# Patient Record
Sex: Female | Born: 1940 | Race: White | Hispanic: Yes | Smoking: Never smoker
Health system: Southern US, Community
[De-identification: ages and names within clinical notes are randomized; demographics above are authoritative.]

## PROBLEM LIST (undated history)

## (undated) DIAGNOSIS — F32A Depression, unspecified: Secondary | ICD-10-CM

## (undated) DIAGNOSIS — E119 Type 2 diabetes mellitus without complications: Secondary | ICD-10-CM

## (undated) DIAGNOSIS — F419 Anxiety disorder, unspecified: Secondary | ICD-10-CM

## (undated) DIAGNOSIS — M1990 Unspecified osteoarthritis, unspecified site: Secondary | ICD-10-CM

## (undated) DIAGNOSIS — R609 Edema, unspecified: Secondary | ICD-10-CM

## (undated) DIAGNOSIS — F329 Major depressive disorder, single episode, unspecified: Secondary | ICD-10-CM

## (undated) DIAGNOSIS — I10 Essential (primary) hypertension: Secondary | ICD-10-CM

## (undated) DIAGNOSIS — N329 Bladder disorder, unspecified: Secondary | ICD-10-CM

## (undated) HISTORY — DX: Depression, unspecified: F32.A

## (undated) HISTORY — PX: KNEE SURGERY: SHX244

## (undated) HISTORY — DX: Major depressive disorder, single episode, unspecified: F32.9

## (undated) HISTORY — DX: Edema, unspecified: R60.9

## (undated) HISTORY — DX: Anxiety disorder, unspecified: F41.9

## (undated) HISTORY — DX: Essential (primary) hypertension: I10

## (undated) HISTORY — PX: ANKLE SURGERY: SHX546

## (undated) HISTORY — DX: Type 2 diabetes mellitus without complications: E11.9

## (undated) HISTORY — PX: FOOT SURGERY: SHX648

## (undated) HISTORY — DX: Unspecified osteoarthritis, unspecified site: M19.90

## (undated) HISTORY — DX: Bladder disorder, unspecified: N32.9

## (undated) HISTORY — PX: SHOULDER SURGERY: SHX246

---

## 2014-11-07 ENCOUNTER — Ambulatory Visit: Payer: Self-pay

## 2014-11-14 ENCOUNTER — Ambulatory Visit (INDEPENDENT_AMBULATORY_CARE_PROVIDER_SITE_OTHER): Payer: Medicare Other

## 2014-11-14 VITALS — BP 152/82 | HR 82 | Resp 18

## 2014-11-14 DIAGNOSIS — E11610 Type 2 diabetes mellitus with diabetic neuropathic arthropathy: Secondary | ICD-10-CM

## 2014-11-14 DIAGNOSIS — S92301S Fracture of unspecified metatarsal bone(s), right foot, sequela: Secondary | ICD-10-CM | POA: Diagnosis not present

## 2014-11-14 DIAGNOSIS — M158 Other polyosteoarthritis: Secondary | ICD-10-CM | POA: Diagnosis not present

## 2014-11-14 DIAGNOSIS — M2011 Hallux valgus (acquired), right foot: Secondary | ICD-10-CM | POA: Diagnosis not present

## 2014-11-14 DIAGNOSIS — R269 Unspecified abnormalities of gait and mobility: Secondary | ICD-10-CM

## 2014-11-14 DIAGNOSIS — M2041 Other hammer toe(s) (acquired), right foot: Secondary | ICD-10-CM | POA: Diagnosis not present

## 2014-11-14 DIAGNOSIS — E1140 Type 2 diabetes mellitus with diabetic neuropathy, unspecified: Secondary | ICD-10-CM

## 2014-11-14 MED ORDER — TRAMADOL HCL 50 MG PO TABS: 50.0000 mg | ORAL_TABLET | Freq: Two times a day (BID) | ORAL | Status: DC

## 2014-11-14 NOTE — Progress Notes (Signed)
Subjective:    Patient ID: [name], female    DOB: March 28, 1941, 74 y.o.   MRN: [id number]  HPI I BROKE MY RIGHT FOOT AND ANKLE LAST YEAR AND THEY STATED THAT MY TOES WERE BROKE AND 4 METATARSALS AND I FELL BOTH TIMES AND MY RIGHT FOOT POPPED BACK AND IT SWELLS AND WAS BRUISED    Review of Systems  Eyes: Positive for pain and itching.  Cardiovascular: Positive for leg swelling.  Genitourinary: Positive for urgency.  Musculoskeletal: Positive for gait problem.       JOINT PAIN  Allergic/Immunologic: Positive for food allergies.  Neurological: Positive for numbness. Negative for speech difficulty.  All other systems reviewed and are negative.      Objective:   Physical Exam 74 year old white female well-developed considerably overweight oriented 3 presents at this time with complaint of pain in difficult these with her right foot and fractured right ankle and then the sub-Schooley also fractured multiple metatarsals and toes of her right foot within the past year. Had plate fixation of her fibula on the right ankle apparently one or 2 wires placed metatarsal areas however x-rays that she brought from last year indicated only a single wire that was fixated with multiple metatarsal fractures and translocation or dislocation as well as notable HAV deformity and hammertoe deformities.'s role affecting her right foot. Should note patient's for lower extremity objective findings vascular status is intact DP and PT thready at one over 4 bilateral +1 to +2 edema noted bilateral. Epicritic and proprioceptive sensations intact although diminished on Semmes Weinstein to the forefoot digits and arch normal plantar response DTRs not listed. Dermatologically skin color pigment normal hair growth absent nails criptotic incurvated orthopedic biomechanical exam severe promontory changes noted bilateral with hammertoe contractures and bunion deformity bilateral right seems worse than left. The right foot is  severely abducted the ankle in the midfoot and forefoot with complaint at complete collapse of the arch x-rays confirm fracture metatarsals no new x-rays taken at this time to assess healing however based on the deformity and the forefoot midfoot and rear foot has severe pedis planus foot type of the right foot more so than left. Patient currently wearing a surgical type shoe or boot to accommodate the deformity she presents at this time with prescription for advanced prosthetics orthotic hoping to get diabetic shoes here however advised she was prescribed custom shoes which we do not provide any new prescription at this time for [employer] for custom shoes to be done here in [place] was given.       Assessment & Plan:  Assessment this time his diabetes with history peripheral neuropathy and angiopathy history of fracture and gait abnormality is resulted arthropathy and deformity of her right foot. Patient walks with the assistance of a walker and AFO boot or shoe. Benefit from custom molded shoes and double upright AFO attached brace to the right foot in boot.  Prescription for [employer] is given at this time. Patient also will follow-up within next week to 2 or 3 weeks for diabetic foot and nail care debridement of nails per patient request. 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 Recommendation is time is accommodate therapy with shoes and regular diabetic foot care and accommodation. We'll follow-up with the next several weeks for palliative care and follow-up and observe her custom shoes and AFO brace once those are available. Next patient also did request something  for pain prescription for tramadol is issued at this time she is already taking gabapentin and meloxicam.  Alvan Dame DPM

## 2014-11-14 NOTE — Patient Instructions (Signed)
Diabetes and Foot Care Diabetes may cause you to have problems because of poor blood supply (circulation) to your feet and legs. This may cause the skin on your feet to become thinner, break easier, and heal more slowly. Your skin may become dry, and the skin may peel and crack. You may also have nerve damage in your legs and feet causing decreased feeling in them. You may not notice minor injuries to your feet that could lead to infections or more serious problems. Taking care of your feet is one of the most important things you can do for yourself.  HOME CARE INSTRUCTIONS  Wear shoes at all times, even in the house. Do not go barefoot. Bare feet are easily injured.  Check your feet daily for blisters, cuts, and redness. If you cannot see the bottom of your feet, use a mirror or ask someone for help.  Wash your feet with warm water (do not use hot water) and mild soap. Then pat your feet and the areas between your toes until they are completely dry. Do not soak your feet as this can dry your skin.  Apply a moisturizing lotion or petroleum jelly (that does not contain alcohol and is unscented) to the skin on your feet and to dry, brittle toenails. Do not apply lotion between your toes.  Trim your toenails straight across. Do not dig under them or around the cuticle. File the edges of your nails with an emery board or nail file.  Do not cut corns or calluses or try to remove them with medicine.  Wear clean socks or stockings every day. Make sure they are not too tight. Do not wear knee-high stockings since they may decrease blood flow to your legs.  Wear shoes that fit properly and have enough cushioning. To break in new shoes, wear them for just a few hours a day. This prevents you from injuring your feet. Always look in your shoes before you put them on to be sure there are no objects inside.  Do not cross your legs. This may decrease the blood flow to your feet.  If you find a minor scrape,  cut, or break in the skin on your feet, keep it and the skin around it clean and dry. These areas may be cleansed with mild soap and water. Do not cleanse the area with peroxide, alcohol, or iodine.  When you remove an adhesive bandage, be sure not to damage the skin around it.  If you have a wound, look at it several times a day to make sure it is healing.  Do not use heating pads or hot water bottles. They may burn your skin. If you have lost feeling in your feet or legs, you may not know it is happening until it is too late.  Make sure your health care provider performs a complete foot exam at least annually or more often if you have foot problems. Report any cuts, sores, or bruises to your health care provider immediately. SEEK MEDICAL CARE IF:   You have an injury that is not healing.  You have cuts or breaks in the skin.  You have an ingrown nail.  You notice redness on your legs or feet.  You feel burning or tingling in your legs or feet.  You have pain or cramps in your legs and feet.  Your legs or feet are numb.  Your feet always feel cold. SEEK IMMEDIATE MEDICAL CARE IF:   There is increasing redness,   swelling, or pain in or around a wound.  There is a red line that goes up your leg.  Pus is coming from a wound.  You develop a fever or as directed by your health care provider.  You notice a bad smell coming from an ulcer or wound. Document Released: 09/20/2000 Document Revised: 05/26/2013 Document Reviewed: 03/02/2013 Exit[employer] Patient Information 2015 [employer]. This information is not intended to replace advice given to you by your health care provider. Make sure you discuss any questions you have with your health care provider.  

## 2014-11-28 ENCOUNTER — Ambulatory Visit (INDEPENDENT_AMBULATORY_CARE_PROVIDER_SITE_OTHER): Payer: Medicare Other

## 2014-11-28 VITALS — BP 159/68 | HR 85 | Resp 18

## 2014-11-28 DIAGNOSIS — M79676 Pain in unspecified toe(s): Secondary | ICD-10-CM

## 2014-11-28 DIAGNOSIS — R269 Unspecified abnormalities of gait and mobility: Secondary | ICD-10-CM

## 2014-11-28 DIAGNOSIS — E1140 Type 2 diabetes mellitus with diabetic neuropathy, unspecified: Secondary | ICD-10-CM

## 2014-11-28 DIAGNOSIS — B351 Tinea unguium: Secondary | ICD-10-CM

## 2014-11-28 DIAGNOSIS — E11610 Type 2 diabetes mellitus with diabetic neuropathic arthropathy: Secondary | ICD-10-CM

## 2014-11-28 DIAGNOSIS — R609 Edema, unspecified: Secondary | ICD-10-CM

## 2014-11-28 MED ORDER — TAVABOROLE 5 % EX SOLN: CUTANEOUS | Status: DC

## 2014-11-28 NOTE — Progress Notes (Signed)
   Subjective:    Patient ID: [name], female    DOB: Jul 11, 1941, 74 y.o.   MRN: [id number]  HPI MY NAILS ARE REALLY THICK AND NEED TO BE CUT    Review of Systems no new findings or systemic changes noted. Patient has had increases and edema is not wearing compression stockings take a fluid pill however has venous insufficiency and edema and venous stasis dermatitis of both lower extremities.     Objective:   Physical Exam Neurovascular status unchanged pedal pulses are palpable DP +2 PT plus one over 4 bilateral Refill timed 3-4 seconds there is +2 pitting edema both lower extremities with venous insufficiency and venous stasis the patient walks with the assistance of a walker nails thick brittle crumbly friable discolored and brittle was not been a candidate for on time fungal this time requesting treatment for the fungus and prescription for Jodi GeraldsKerydin is issued through the online pharmacy will apply daily as instructed patient advised she needs to apply once daily for 12 month duration. Remainder the exam unremarkable no open wounds no ulcers ulcers no secondary infections.       Assessment & Plan:  Assessment diabetes with complications arthrosis and Charcot like changes of the foot walks with gait abnormality utilizing walker for assistance this time painful brittle crumbly friable thick discolored nails 1 through 5 bilateral are debrided and presence of pain symptomology as well as diabetes and complications. Also dispensed prescription for Kerydin to be furnished onto the patient will apply daily for 12 months duration as instructed. Reappointed 3 months for nail care  Alvan Dameichard Bladyn Tipps DPM

## 2014-11-28 NOTE — Patient Instructions (Signed)
Onychomycosis/Fungal Toenails  WHAT IS IT? An infection that lies within the keratin of your nail plate that is caused by a fungus.  WHY ME? Fungal infections affect all ages, sexes, races, and creeds.  There may be many factors that predispose you to a fungal infection such as age, coexisting medical conditions such as diabetes, or an autoimmune disease; stress, medications, fatigue, genetics, etc.  Bottom line: fungus thrives in a warm, moist environment and your shoes offer such a location.  IS IT CONTAGIOUS? Theoretically, yes.  You do not want to share shoes, nail clippers or files with someone who has fungal toenails.  Walking around barefoot in the same room or sleeping in the same bed is unlikely to transfer the organism.  It is important to realize, however, that fungus can spread easily from one nail to the next on the same foot.  HOW DO WE TREAT THIS?  There are several ways to treat this condition.  Treatment may depend on many factors such as age, medications, pregnancy, liver and kidney conditions, etc.  It is best to ask your doctor which options are available to you.  1. No treatment.   Unlike many other medical concerns, you can live with this condition.  However for many people this can be a painful condition and may lead to ingrown toenails or a bacterial infection.  It is recommended that you keep the nails cut short to help reduce the amount of fungal nail. 2. Topical treatment.  These range from herbal remedies to prescription strength nail lacquers.  About 40-50% effective, topicals require twice daily application for approximately 9 to 12 months or until an entirely new nail has grown out.  The most effective topicals are medical grade medications available through physicians offices. 3. Oral antifungal medications.  With an 80-90% cure rate, the most common oral medication requires 3 to 4 months of therapy and stays in your system for a year as the new nail grows out.  Oral  antifungal medications do require blood work to make sure it is a safe drug for you.  A liver function panel will be performed prior to starting the medication and after the first month of treatment.  It is important to have the blood work performed to avoid any harmful side effects.  In general, this medication safe but blood work is required. 4. Laser Therapy.  This treatment is performed by applying a specialized laser to the affected nail plate.  This therapy is noninvasive, fast, and non-painful.  It is not covered by insurance and is therefore, out of pocket.  The results have been very good with a 80-95% cure rate.  [facility] is the only practice in the area to offer this therapy. 5. Permanent Nail Avulsion.  Removing the entire nail so that a new nail will not grow back.   Josph Machobtain Kerydin by mail through [facility]. Apply medication once daily to each affected nail for 12 months as instructed

## 2015-01-26 ENCOUNTER — Ambulatory Visit: Payer: Medicare Other

## 2015-01-27 ENCOUNTER — Ambulatory Visit: Payer: Medicare Other

## 2015-12-05 DIAGNOSIS — J449 Chronic obstructive pulmonary disease, unspecified: Secondary | ICD-10-CM | POA: Diagnosis present

## 2015-12-05 DIAGNOSIS — E782 Mixed hyperlipidemia: Secondary | ICD-10-CM | POA: Diagnosis present

## 2015-12-05 DIAGNOSIS — G4733 Obstructive sleep apnea (adult) (pediatric): Secondary | ICD-10-CM | POA: Diagnosis present

## 2015-12-05 DIAGNOSIS — F331 Major depressive disorder, recurrent, moderate: Secondary | ICD-10-CM | POA: Diagnosis present

## 2015-12-05 DIAGNOSIS — F5101 Primary insomnia: Secondary | ICD-10-CM | POA: Diagnosis present

## 2015-12-05 DIAGNOSIS — F068 Other specified mental disorders due to known physiological condition: Secondary | ICD-10-CM | POA: Diagnosis present

## 2015-12-05 DIAGNOSIS — F411 Generalized anxiety disorder: Secondary | ICD-10-CM | POA: Diagnosis present

## 2015-12-05 DIAGNOSIS — E1142 Type 2 diabetes mellitus with diabetic polyneuropathy: Secondary | ICD-10-CM | POA: Diagnosis present

## 2015-12-05 DIAGNOSIS — I10 Essential (primary) hypertension: Secondary | ICD-10-CM | POA: Diagnosis present

## 2015-12-05 DIAGNOSIS — I340 Nonrheumatic mitral (valve) insufficiency: Secondary | ICD-10-CM | POA: Diagnosis present

## 2015-12-05 DIAGNOSIS — R269 Unspecified abnormalities of gait and mobility: Secondary | ICD-10-CM

## 2015-12-05 DIAGNOSIS — G500 Trigeminal neuralgia: Secondary | ICD-10-CM | POA: Diagnosis present

## 2015-12-05 DIAGNOSIS — M5090 Cervical disc disorder, unspecified, unspecified cervical region: Secondary | ICD-10-CM | POA: Diagnosis present

## 2015-12-20 DIAGNOSIS — E088 Diabetes mellitus due to underlying condition with unspecified complications: Secondary | ICD-10-CM | POA: Diagnosis present

## 2015-12-20 DIAGNOSIS — E785 Hyperlipidemia, unspecified: Secondary | ICD-10-CM | POA: Diagnosis present

## 2016-08-07 DIAGNOSIS — Z66 Do not resuscitate: Secondary | ICD-10-CM | POA: Diagnosis present

## 2016-08-07 DIAGNOSIS — N3941 Urge incontinence: Secondary | ICD-10-CM | POA: Diagnosis present

## 2016-08-28 ENCOUNTER — Inpatient Hospital Stay (HOSPITAL_COMMUNITY): Payer: Medicare Other

## 2016-08-28 ENCOUNTER — Inpatient Hospital Stay (HOSPITAL_COMMUNITY)
Admission: AD | Admit: 2016-08-28 | Discharge: 2016-09-03 | DRG: 871 | Disposition: A | Discharge status: Skilled Nursing Facility | Payer: Medicare Other | Source: Other Acute Inpatient Hospital | Attending: Family Medicine | Admitting: Family Medicine

## 2016-08-28 DIAGNOSIS — Z66 Do not resuscitate: Secondary | ICD-10-CM | POA: Diagnosis present

## 2016-08-28 DIAGNOSIS — J449 Chronic obstructive pulmonary disease, unspecified: Secondary | ICD-10-CM | POA: Diagnosis not present

## 2016-08-28 DIAGNOSIS — G9341 Metabolic encephalopathy: Secondary | ICD-10-CM | POA: Diagnosis present

## 2016-08-28 DIAGNOSIS — E1142 Type 2 diabetes mellitus with diabetic polyneuropathy: Secondary | ICD-10-CM | POA: Diagnosis present

## 2016-08-28 DIAGNOSIS — M5090 Cervical disc disorder, unspecified, unspecified cervical region: Secondary | ICD-10-CM | POA: Diagnosis present

## 2016-08-28 DIAGNOSIS — K219 Gastro-esophageal reflux disease without esophagitis: Secondary | ICD-10-CM | POA: Diagnosis not present

## 2016-08-28 DIAGNOSIS — Z880 Allergy status to penicillin: Secondary | ICD-10-CM

## 2016-08-28 DIAGNOSIS — L8990 Pressure ulcer of unspecified site, unspecified stage: Secondary | ICD-10-CM | POA: Insufficient documentation

## 2016-08-28 DIAGNOSIS — Z7982 Long term (current) use of aspirin: Secondary | ICD-10-CM

## 2016-08-28 DIAGNOSIS — E785 Hyperlipidemia, unspecified: Secondary | ICD-10-CM | POA: Diagnosis present

## 2016-08-28 DIAGNOSIS — A419 Sepsis, unspecified organism: Secondary | ICD-10-CM

## 2016-08-28 DIAGNOSIS — R410 Disorientation, unspecified: Secondary | ICD-10-CM | POA: Diagnosis present

## 2016-08-28 DIAGNOSIS — Z794 Long term (current) use of insulin: Secondary | ICD-10-CM

## 2016-08-28 DIAGNOSIS — E870 Hyperosmolality and hypernatremia: Secondary | ICD-10-CM | POA: Diagnosis present

## 2016-08-28 DIAGNOSIS — E860 Dehydration: Secondary | ICD-10-CM | POA: Diagnosis present

## 2016-08-28 DIAGNOSIS — Z888 Allergy status to other drugs, medicaments and biological substances status: Secondary | ICD-10-CM

## 2016-08-28 DIAGNOSIS — N390 Urinary tract infection, site not specified: Secondary | ICD-10-CM

## 2016-08-28 DIAGNOSIS — F331 Major depressive disorder, recurrent, moderate: Secondary | ICD-10-CM | POA: Diagnosis present

## 2016-08-28 DIAGNOSIS — N179 Acute kidney failure, unspecified: Secondary | ICD-10-CM | POA: Diagnosis present

## 2016-08-28 DIAGNOSIS — N3941 Urge incontinence: Secondary | ICD-10-CM | POA: Diagnosis present

## 2016-08-28 DIAGNOSIS — F5101 Primary insomnia: Secondary | ICD-10-CM | POA: Diagnosis present

## 2016-08-28 DIAGNOSIS — Z79899 Other long term (current) drug therapy: Secondary | ICD-10-CM

## 2016-08-28 DIAGNOSIS — E782 Mixed hyperlipidemia: Secondary | ICD-10-CM | POA: Diagnosis present

## 2016-08-28 DIAGNOSIS — G9340 Encephalopathy, unspecified: Secondary | ICD-10-CM

## 2016-08-28 DIAGNOSIS — E11649 Type 2 diabetes mellitus with hypoglycemia without coma: Secondary | ICD-10-CM | POA: Diagnosis not present

## 2016-08-28 DIAGNOSIS — F411 Generalized anxiety disorder: Secondary | ICD-10-CM | POA: Diagnosis present

## 2016-08-28 DIAGNOSIS — F0390 Unspecified dementia without behavioral disturbance: Secondary | ICD-10-CM | POA: Diagnosis present

## 2016-08-28 DIAGNOSIS — A4181 Sepsis due to Enterococcus: Secondary | ICD-10-CM | POA: Diagnosis present

## 2016-08-28 DIAGNOSIS — I10 Essential (primary) hypertension: Secondary | ICD-10-CM | POA: Diagnosis not present

## 2016-08-28 DIAGNOSIS — Z7401 Bed confinement status: Secondary | ICD-10-CM | POA: Diagnosis not present

## 2016-08-28 DIAGNOSIS — E876 Hypokalemia: Secondary | ICD-10-CM | POA: Diagnosis not present

## 2016-08-28 DIAGNOSIS — R269 Unspecified abnormalities of gait and mobility: Secondary | ICD-10-CM

## 2016-08-28 DIAGNOSIS — Z515 Encounter for palliative care: Secondary | ICD-10-CM | POA: Diagnosis not present

## 2016-08-28 DIAGNOSIS — E088 Diabetes mellitus due to underlying condition with unspecified complications: Secondary | ICD-10-CM | POA: Diagnosis not present

## 2016-08-28 DIAGNOSIS — G4733 Obstructive sleep apnea (adult) (pediatric): Secondary | ICD-10-CM | POA: Diagnosis present

## 2016-08-28 DIAGNOSIS — F068 Other specified mental disorders due to known physiological condition: Secondary | ICD-10-CM | POA: Diagnosis present

## 2016-08-28 DIAGNOSIS — R4189 Other symptoms and signs involving cognitive functions and awareness: Secondary | ICD-10-CM

## 2016-08-28 DIAGNOSIS — Z1621 Resistance to vancomycin: Secondary | ICD-10-CM | POA: Diagnosis present

## 2016-08-28 DIAGNOSIS — M1990 Unspecified osteoarthritis, unspecified site: Secondary | ICD-10-CM | POA: Diagnosis present

## 2016-08-28 DIAGNOSIS — I340 Nonrheumatic mitral (valve) insufficiency: Secondary | ICD-10-CM | POA: Diagnosis present

## 2016-08-28 DIAGNOSIS — G500 Trigeminal neuralgia: Secondary | ICD-10-CM | POA: Diagnosis present

## 2016-08-28 DIAGNOSIS — W19XXXA Unspecified fall, initial encounter: Secondary | ICD-10-CM

## 2016-08-28 DIAGNOSIS — Z9181 History of falling: Secondary | ICD-10-CM | POA: Diagnosis not present

## 2016-08-28 DIAGNOSIS — G939 Disorder of brain, unspecified: Secondary | ICD-10-CM | POA: Diagnosis present

## 2016-08-28 LAB — COMPREHENSIVE METABOLIC PANEL
ALBUMIN: 2.2 g/dL — AB (ref 3.5–5.0)
ALK PHOS: 77 U/L (ref 38–126)
ALT: 26 U/L (ref 14–54)
AST: 41 U/L (ref 15–41)
BUN: 127 mg/dL — ABNORMAL HIGH (ref 6–20)
CALCIUM: 8.3 mg/dL — AB (ref 8.9–10.3)
CO2: 20 mmol/L — AB (ref 22–32)
Creatinine, Ser: 4.19 mg/dL — ABNORMAL HIGH (ref 0.44–1.00)
GFR calc non Af Amer: 9 mL/min — ABNORMAL LOW (ref >60)
GFR, EST AFRICAN AMERICAN: 11 mL/min — AB (ref >60)
GLUCOSE: 205 mg/dL — AB (ref 65–99)
POTASSIUM: 6.5 mmol/L — AB (ref 3.5–5.1)
SODIUM: 172 mmol/L — AB (ref 135–145)
Total Bilirubin: 0.5 mg/dL (ref 0.3–1.2)
Total Protein: 5.7 g/dL — ABNORMAL LOW (ref 6.5–8.1)

## 2016-08-28 LAB — URINALYSIS, ROUTINE W REFLEX MICROSCOPIC
Bilirubin Urine: NEGATIVE
GLUCOSE, UA: NEGATIVE mg/dL
Ketones, ur: NEGATIVE mg/dL
Nitrite: NEGATIVE
PROTEIN: NEGATIVE mg/dL
SPECIFIC GRAVITY, URINE: 1.017 (ref 1.005–1.030)
pH: 5 (ref 5.0–8.0)

## 2016-08-28 LAB — HIV ANTIBODY (ROUTINE TESTING W REFLEX): HIV SCREEN 4TH GENERATION: NONREACTIVE

## 2016-08-28 LAB — CBC WITH DIFFERENTIAL/PLATELET
BASOS PCT: 0 %
Basophils Absolute: 0 10*3/uL (ref 0.0–0.1)
EOS ABS: 0 10*3/uL (ref 0.0–0.7)
EOS PCT: 0 %
HCT: 53.2 % — ABNORMAL HIGH (ref 36.0–46.0)
HEMOGLOBIN: 16.4 g/dL — AB (ref 12.0–15.0)
LYMPHS PCT: 7 %
Lymphs Abs: 2.2 10*3/uL (ref 0.7–4.0)
MCH: 29.5 pg (ref 26.0–34.0)
MCHC: 30.8 g/dL (ref 30.0–36.0)
MCV: 95.7 fL (ref 78.0–100.0)
MONO ABS: 1.3 10*3/uL — AB (ref 0.1–1.0)
Monocytes Relative: 4 %
NEUTROS PCT: 89 %
Neutro Abs: 28.5 10*3/uL — ABNORMAL HIGH (ref 1.7–7.7)
PLATELETS: 257 10*3/uL (ref 150–400)
RBC: 5.56 MIL/uL — AB (ref 3.87–5.11)
RDW: 16.6 % — ABNORMAL HIGH (ref 11.5–15.5)
WBC: 32 10*3/uL — AB (ref 4.0–10.5)

## 2016-08-28 LAB — GLUCOSE, CAPILLARY
GLUCOSE-CAPILLARY: 132 mg/dL — AB (ref 65–99)
GLUCOSE-CAPILLARY: 79 mg/dL (ref 65–99)
GLUCOSE-CAPILLARY: 171 mg/dL — AB (ref 65–99)
Glucose-Capillary: 127 mg/dL — ABNORMAL HIGH (ref 65–99)

## 2016-08-28 LAB — LACTIC ACID, PLASMA
LACTIC ACID, VENOUS: 2.5 mmol/L — AB (ref 0.5–1.9)
Lactic Acid, Venous: 2.5 mmol/L (ref 0.5–1.9)

## 2016-08-28 LAB — SODIUM, URINE, RANDOM: SODIUM UR: 18 mmol/L

## 2016-08-28 LAB — RAPID URINE DRUG SCREEN, HOSP PERFORMED
Amphetamines: NOT DETECTED
BARBITURATES: NOT DETECTED
BENZODIAZEPINES: NOT DETECTED
COCAINE: NOT DETECTED
Opiates: NOT DETECTED
Tetrahydrocannabinol: NOT DETECTED

## 2016-08-28 LAB — PROTIME-INR
INR: 1.34
Prothrombin Time: 16.7 seconds — ABNORMAL HIGH (ref 11.4–15.2)

## 2016-08-28 LAB — BASIC METABOLIC PANEL
BUN: 129 mg/dL — ABNORMAL HIGH (ref 6–20)
CALCIUM: 8.4 mg/dL — AB (ref 8.9–10.3)
CO2: 22 mmol/L (ref 22–32)
CREATININE: 4.1 mg/dL — AB (ref 0.44–1.00)
Chloride: >130 mmol/L (ref 101–111)
GFR, EST AFRICAN AMERICAN: 11 mL/min — AB (ref >60)
GFR, EST NON AFRICAN AMERICAN: 10 mL/min — AB (ref >60)
Glucose, Bld: 133 mg/dL — ABNORMAL HIGH (ref 65–99)
Potassium: 5.8 mmol/L — ABNORMAL HIGH (ref 3.5–5.1)
SODIUM: 175 mmol/L — AB (ref 135–145)

## 2016-08-28 LAB — URINE MICROSCOPIC-ADD ON

## 2016-08-28 LAB — TROPONIN I
TROPONIN I: 0.13 ng/mL — AB (ref <0.03)
Troponin I: 0.03 ng/mL (ref <0.03)
Troponin I: 0.03 ng/mL (ref <0.03)

## 2016-08-28 LAB — APTT: APTT: 22 s — AB (ref 24–36)

## 2016-08-28 LAB — AMMONIA: AMMONIA: 21 umol/L (ref 9–35)

## 2016-08-28 LAB — CREATININE, URINE, RANDOM: CREATININE, URINE: 93.75 mg/dL

## 2016-08-28 LAB — PHOSPHORUS: Phosphorus: 6.2 mg/dL — ABNORMAL HIGH (ref 2.5–4.6)

## 2016-08-28 LAB — MAGNESIUM: MAGNESIUM: 3 mg/dL — AB (ref 1.7–2.4)

## 2016-08-28 LAB — RPR: RPR: NONREACTIVE

## 2016-08-28 LAB — CK: Total CK: 180 U/L (ref 38–234)

## 2016-08-28 LAB — MRSA PCR SCREENING: MRSA by PCR: POSITIVE — AB

## 2016-08-28 MED ORDER — HYDROCHLOROTHIAZIDE 25 MG PO TABS: 25.0000 mg | ORAL_TABLET | Freq: Every day | ORAL | Status: DC

## 2016-08-28 MED ORDER — PRAVASTATIN SODIUM 20 MG PO TABS
20.0000 mg | ORAL_TABLET | Freq: Every day | ORAL | Status: DC
  Administered 2016-09-03: 20 mg via ORAL
  Filled 2016-08-28: qty 1

## 2016-08-28 MED ORDER — SODIUM CHLORIDE 0.9 % IV SOLN
1.0000 g | Freq: Once | INTRAVENOUS | Status: AC
  Administered 2016-08-28: 1 g via INTRAVENOUS
  Filled 2016-08-28: qty 10

## 2016-08-28 MED ORDER — LORAZEPAM 2 MG/ML IJ SOLN
0.5000 mg | Freq: Every evening | INTRAMUSCULAR | Status: DC | PRN
  Administered 2016-08-31: 0.5 mg via INTRAVENOUS
  Filled 2016-08-28: qty 1

## 2016-08-28 MED ORDER — ONDANSETRON HCL 4 MG/2ML IJ SOLN: 4.0000 mg | Freq: Four times a day (QID) | INTRAMUSCULAR | Status: DC | PRN

## 2016-08-28 MED ORDER — GABAPENTIN 600 MG PO TABS: 600.0000 mg | ORAL_TABLET | Freq: Three times a day (TID) | ORAL | Status: DC

## 2016-08-28 MED ORDER — SODIUM CHLORIDE 0.9% FLUSH
3.0000 mL | Freq: Two times a day (BID) | INTRAVENOUS | Status: DC
  Administered 2016-08-28 – 2016-09-03 (×8): 3 mL via INTRAVENOUS

## 2016-08-28 MED ORDER — ORAL CARE MOUTH RINSE
15.0000 mL | Freq: Two times a day (BID) | OROMUCOSAL | Status: DC
  Administered 2016-08-29 – 2016-09-03 (×9): 15 mL via OROMUCOSAL

## 2016-08-28 MED ORDER — SODIUM CHLORIDE 0.45 % IV SOLN
INTRAVENOUS | Status: DC
  Administered 2016-08-28: 13:00:00 via INTRAVENOUS

## 2016-08-28 MED ORDER — SODIUM CHLORIDE 0.9 % IV SOLN: 250.0000 mL | INTRAVENOUS | Status: DC | PRN

## 2016-08-28 MED ORDER — CHLORHEXIDINE GLUCONATE 0.12 % MT SOLN
15.0000 mL | Freq: Two times a day (BID) | OROMUCOSAL | Status: DC
  Administered 2016-08-28 – 2016-09-03 (×12): 15 mL via OROMUCOSAL
  Filled 2016-08-28 (×10): qty 15

## 2016-08-28 MED ORDER — LISINOPRIL 20 MG PO TABS: 40.0000 mg | ORAL_TABLET | Freq: Every day | ORAL | Status: DC

## 2016-08-28 MED ORDER — MELOXICAM 7.5 MG PO TABS: 7.5000 mg | ORAL_TABLET | Freq: Every day | ORAL | Status: DC

## 2016-08-28 MED ORDER — INSULIN ASPART 100 UNIT/ML ~~LOC~~ SOLN
0.0000 [IU] | Freq: Every day | SUBCUTANEOUS | Status: DC
Start: 2016-08-28 — End: 2016-08-29

## 2016-08-28 MED ORDER — ACETAMINOPHEN 650 MG RE SUPP
650.0000 mg | Freq: Four times a day (QID) | RECTAL | Status: DC | PRN
Start: 2016-08-28 — End: 2016-09-03

## 2016-08-28 MED ORDER — INSULIN GLARGINE 100 UNIT/ML ~~LOC~~ SOLN
32.0000 [IU] | Freq: Every day | SUBCUTANEOUS | Status: DC
  Administered 2016-08-29 – 2016-09-03 (×6): 32 [IU] via SUBCUTANEOUS
  Filled 2016-08-28 (×6): qty 0.32

## 2016-08-28 MED ORDER — INSULIN ASPART 100 UNIT/ML IV SOLN
10.0000 [IU] | Freq: Once | INTRAVENOUS | Status: AC
  Administered 2016-08-28: 10 [IU] via INTRAVENOUS

## 2016-08-28 MED ORDER — SODIUM CHLORIDE 0.9% FLUSH
3.0000 mL | Freq: Two times a day (BID) | INTRAVENOUS | Status: DC
  Administered 2016-08-28 – 2016-09-03 (×8): 3 mL via INTRAVENOUS

## 2016-08-28 MED ORDER — SODIUM CHLORIDE 0.9% FLUSH: 3.0000 mL | INTRAVENOUS | Status: DC | PRN

## 2016-08-28 MED ORDER — ONDANSETRON HCL 4 MG PO TABS: 4.0000 mg | ORAL_TABLET | Freq: Four times a day (QID) | ORAL | Status: DC | PRN

## 2016-08-28 MED ORDER — SODIUM CHLORIDE 0.9 % IV SOLN
500.0000 mg | Freq: Two times a day (BID) | INTRAVENOUS | Status: DC
  Administered 2016-08-28 – 2016-08-29 (×3): 500 mg via INTRAVENOUS
  Filled 2016-08-28 (×4): qty 0.5

## 2016-08-28 MED ORDER — HYDRALAZINE HCL 20 MG/ML IJ SOLN: 5.0000 mg | Freq: Three times a day (TID) | INTRAMUSCULAR | Status: DC | PRN

## 2016-08-28 MED ORDER — LORAZEPAM 0.5 MG PO TABS: 0.5000 mg | ORAL_TABLET | Freq: Every evening | ORAL | Status: DC | PRN

## 2016-08-28 MED ORDER — HEPARIN SODIUM (PORCINE) 5000 UNIT/ML IJ SOLN
5000.0000 [IU] | Freq: Three times a day (TID) | INTRAMUSCULAR | Status: DC
  Administered 2016-08-28 – 2016-09-03 (×20): 5000 [IU] via SUBCUTANEOUS
  Filled 2016-08-28 (×18): qty 1

## 2016-08-28 MED ORDER — ALBUTEROL SULFATE (2.5 MG/3ML) 0.083% IN NEBU: 2.5000 mg | INHALATION_SOLUTION | RESPIRATORY_TRACT | Status: DC | PRN

## 2016-08-28 MED ORDER — ASPIRIN 81 MG PO CHEW: 81.0000 mg | CHEWABLE_TABLET | Freq: Every day | ORAL | Status: DC

## 2016-08-28 MED ORDER — BUPROPION HCL 100 MG PO TABS
100.0000 mg | ORAL_TABLET | Freq: Every day | ORAL | Status: DC
  Filled 2016-08-28: qty 1

## 2016-08-28 MED ORDER — ACETAMINOPHEN 325 MG PO TABS: 650.0000 mg | ORAL_TABLET | Freq: Four times a day (QID) | ORAL | Status: DC | PRN

## 2016-08-28 MED ORDER — INSULIN ASPART 100 UNIT/ML ~~LOC~~ SOLN
0.0000 [IU] | Freq: Three times a day (TID) | SUBCUTANEOUS | Status: DC
  Administered 2016-08-28: 1 [IU] via SUBCUTANEOUS

## 2016-08-28 MED ORDER — SODIUM CHLORIDE 0.9 % IV BOLUS (SEPSIS)
1000.0000 mL | Freq: Once | INTRAVENOUS | Status: AC
  Administered 2016-08-28: 1000 mL via INTRAVENOUS

## 2016-08-28 MED ORDER — DEXTROSE 5 % AND 0.45 % NACL IV BOLUS
1000.0000 mL | Freq: Once | INTRAVENOUS | Status: AC
  Administered 2016-08-28: 1000 mL via INTRAVENOUS

## 2016-08-28 MED ORDER — VENLAFAXINE HCL ER 150 MG PO CP24: 150.0000 mg | ORAL_CAPSULE | Freq: Every day | ORAL | Status: DC

## 2016-08-28 NOTE — Progress Notes (Signed)
[facility] Shireen QuanSchuster is a 75 year old female with pmh HTN, DM, arthritis, dementia, and bedbound patient who was [facility] nursing facility [facility] with a 4 days history of confusion. Vitals are relatively stable with O2 saturations maintained on 2 L of [place] oxygen. Labs reveal been preceded 28.5, sodium 176, potassium 8.1, BUN 139, creatinine 4.9, glucose 181, and lactic acid 2.4. Urinalysis positive for infection. Patient given Rocephin, total 4 L of normal saline IV fluid, sodium bicarbonate, insulin, calcium gluconate, and dextrose. CPK pending. Foley catheter in place. Patient is a DO NOT RESUSCITATE transfering for possible need of consultative services. Transferring to stepdown bed.

## 2016-08-28 NOTE — Progress Notes (Signed)
CRITICAL VALUE ALERT  Critical value received:  Lactic Acid 2.5  Date of notification:  08/28/2016  Time of notification:  12:09 PM  Critical value read back:yes   Paged MD Margot AblesMerrill to inform of critical value.

## 2016-08-28 NOTE — H&P (Signed)
History and Physical    [name] ZOX:[id number] DOB: 01/21/1941 DOA: 08/28/2016   PCP: Feliciana Rossetti, MD   Patient coming from:  Home  Chief Complaint: confusion  HPI: [name] is a 75 y.o. female with medical history significant for HTN, diabetes, arthritis, dementia, generalized anxiety disorder, COPD, hyperlipidemia, insomnia, history of trigeminal neuralgia, history of major depressive disorders, obstructive sleep apnea,History of recurrent UTIs  Recently treated with 3 days of Cipro from 08-18-10/14 , bedbound patient brought from nursing facility to [facility] with a 4 day history of confusion. At the time of visit, patient is very confused and unable to provide history.   Per chart report, the patient had been seen at [facility] multiple times due to falls, abnormal glucose issues, etc., and was last seen at the PCP office on 08/07/2016, acutely ill, but at the time was not appeared  toxic. At the time  her medications were adjusted to improve her overall clinical status without t significant improvement She has been admitted for continuation of care, at step down bed. She is DNR   At [facility], Theodoro Kos were relatively stable with O2 saturations maintained on 2 L of [place] oxygen.  sodium 176, potassium 8.1, BUN 139, creatinine 4.9, glucose 181, and lactic acid 2.4. Urinalysis was positive for infection. CPK 268. Tn neg  Patient given Rocephin, total 4 L of normal saline IV fluid, sodium bicarbonate, insulin, calcium gluconate, and dextrose.  On admission,  New labs as follows: MRSA by PCR +. WBC 32  CXR negative for acute changes. Abd XR neg for acute findings. EKG SR. Tn neg PT 16.7/ INR 1.34 Glu 134   Hb 16.4  (in th esetting of dehydration)  sodium 172 potassium 6.5 (slowly improving)  chloride 130 bicarb 20 BUN 127 creatinine 4.19, calcium 8.3 CK 180  UDS is negative  Attempted to call the number listed in contacts but no answer was obtained.  On  presentation   BP (!) 117/39 (BP Location: Right Arm)   Pulse 75   Temp 98.6 F (37 C) (Axillary)   Resp 17   SpO2 96%  Labs are pending     Review of Systems: As per HPI otherwise 10 point review of systems negative.   Past Medical History:  Diagnosis Date  . Anxiety   . Arthritis   . Bladder problem   . Depression   . Diabetes mellitus without complication   . Hypertension   . Swelling     Past Surgical History:  Procedure Laterality Date  . ANKLE SURGERY     RIGHT  . FOOT SURGERY     RIGHT FOOT  . KNEE SURGERY    . SHOULDER SURGERY      Social History Social History   Social History  . Marital status: Widowed    Spouse name: N/A  . Number of children: N/A  . Years of education: N/A   Occupational History  . Not on file.   Social History Main Topics  . Smoking status: Never Smoker  . Smokeless tobacco: Never Used  . Alcohol use No  . Drug use: No  . Sexual activity: Not on file   Other Topics Concern  . Not on file   Social History Narrative  . No narrative on file     Allergies  Allergen Reactions  . Penicillins Rash    No family history on file.    Prior to Admission medications   Medication Sig Start Date End Date Taking?  Authorizing Provider  amLODipine (NORVASC) 5 MG tablet Take 5 mg by mouth daily.    Historical Provider, MD  aspirin 81 MG chewable tablet Chew 81 mg by mouth.    Historical Provider, MD  buPROPion (WELLBUTRIN) 100 MG tablet Take 100 mg by mouth.    Historical Provider, MD  gabapentin (NEURONTIN) 600 MG tablet Take 600 mg by mouth 3 (three) times daily.    Historical Provider, MD  hydrochlorothiazide (HYDRODIURIL) 25 MG tablet Take 25 mg by mouth daily.    Historical Provider, MD  insulin glargine (LANTUS) 100 UNIT/ML injection Inject 64 Units into the skin.    Historical Provider, MD  lisinopril (PRINIVIL,ZESTRIL) 40 MG tablet Take 40 mg by mouth.    Historical Provider, MD  LORazepam (ATIVAN) 0.5 MG tablet Take 0.5  mg by mouth.    Historical Provider, MD  meloxicam (MOBIC) 7.5 MG tablet Take 7.5 mg by mouth.    Historical Provider, MD  oxycodone (OXY-IR) 5 MG capsule Take 5 mg by mouth.    Historical Provider, MD  pravastatin (PRAVACHOL) 20 MG tablet Take 20 mg by mouth daily.    Historical Provider, MD  Tavaborole (KERYDIN) 5 % SOLN Apply one drop each affected nail once daily for 12 months 11/28/14   Alvan Dameichard Sikora, DPM  traMADol (ULTRAM) 50 MG tablet Take 1 tablet (50 mg total) by mouth 2 (two) times daily. 11/14/14   Alvan Dameichard Sikora, DPM  UNABLE TO FIND Take 1 capsule by mouth.    Historical Provider, MD  UNABLE TO FIND Take 1 tablet by mouth. 03/03/14   Historical Provider, MD  venlafaxine XR (EFFEXOR-XR) 150 MG 24 hr capsule Take 150 mg by mouth daily with breakfast.    Historical Provider, MD    Physical Exam:  Constitutional: uncomfortable, ill  appearing, lethargic, minimally responsive  Vitals:   08/28/16 0817  BP: (!) 117/39  Pulse: 75  Resp: 17  Temp: 98.6 F (37 C)  TempSrc: Axillary  SpO2: 96%   Eyes: PERRL, lids and conjunctivae normal ENMT: Mucous membranes are dry. Posterior pharynx clear of any exudate or lesions.edentulous  Neck: normal, supple, no masses, no thyromegaly Respiratory: diffuse rhonchi L>R, crackles builaterally, decreased breath sounds on the Right  No accessory muscle use.  Cardiovascular: Regular rate and rhythm, 1/6  murmurs / rubs / gallops. Trace edema on the LLE, 1 + on the RLE extremity edema. 2+ pedal pulses. No carotid bruits.  Abdomen:  Obese, no apparent  tenderness, no masses palpated. No hepatosplenomegaly. Bowel sounds positive. Foley without blood  Musculoskeletal: no clubbing / cyanosis. Visible deformity on feet, decreased muscle tone   Skin: no rashes, lesions, ulcers.  Neurologic: unable to assess as patient is minimally interactive. Does respond to touch, light and sound    Labs on Admission: pending   CBC:  Recent Labs Lab 08/28/16 0912    WBC 32.0*  NEUTROABS 28.5*  HGB 16.4*  HCT 53.2*  MCV 95.7  PLT 257    Basic Metabolic Panel:  Recent Labs Lab 08/28/16 0912  NA 172*  K 6.5*  CL >130*  CO2 20*  GLUCOSE 205*  BUN 127*  CREATININE 4.19*  CALCIUM 8.3*  MG 3.0*  PHOS 6.2*    GFR: CrCl cannot be calculated (Unknown ideal weight.).  Liver Function Tests:  Recent Labs Lab 08/28/16 0912  AST 41  ALT 26  ALKPHOS 77  BILITOT 0.5  PROT 5.7*  ALBUMIN 2.2*   No results for input(s): LIPASE, AMYLASE in  the last 168 hours.  Recent Labs Lab 08/28/16 0912  AMMONIA 21    Coagulation Profile:  Recent Labs Lab 08/28/16 0912  INR 1.34    Cardiac Enzymes:  Recent Labs Lab 08/28/16 0912  CKTOTAL 180  TROPONINI 0.03*    BNP (last 3 results) No results for input(s): PROBNP in the last 8760 hours.  HbA1C: No results for input(s): HGBA1C in the last 72 hours.  CBG:  Recent Labs Lab 08/28/16 0905  GLUCAP 132*    Lipid Profile: No results for input(s): CHOL, HDL, LDLCALC, TRIG, CHOLHDL, LDLDIRECT in the last 72 hours.  Thyroid Function Tests: No results for input(s): TSH, T4TOTAL, FREET4, T3FREE, THYROIDAB in the last 72 hours.  Anemia Panel: No results for input(s): VITAMINB12, FOLATE, FERRITIN, TIBC, IRON, RETICCTPCT in the last 72 hours.  Urine analysis:    Component Value Date/Time   COLORURINE YELLOW 08/28/2016 1056   APPEARANCEUR HAZY (A) 08/28/2016 1056   LABSPEC 1.017 08/28/2016 1056   PHURINE 5.0 08/28/2016 1056   GLUCOSEU NEGATIVE 08/28/2016 1056   HGBUR MODERATE (A) 08/28/2016 1056   BILIRUBINUR NEGATIVE 08/28/2016 1056   KETONESUR NEGATIVE 08/28/2016 1056   PROTEINUR NEGATIVE 08/28/2016 1056   NITRITE NEGATIVE 08/28/2016 1056   LEUKOCYTESUR LARGE (A) 08/28/2016 1056    Sepsis Labs: @LABRCNTIP (procalcitonin:4,lacticidven:4) ) Recent Results (from the past 240 hour(s))  MRSA PCR Screening     Status: Abnormal   Collection Time: 08/28/16  6:44 AM  Result  Value Ref Range Status   MRSA by PCR POSITIVE (A) NEGATIVE Final    Comment:        The GeneXpert MRSA Assay (FDA approved for NASAL specimens only), is one component of a comprehensive MRSA colonization surveillance program. It is not intended to diagnose MRSA infection nor to guide or monitor treatment for MRSA infections. RESULT CALLED TO, READ BACK BY AND VERIFIED WITH: Edison PaceS. JACKSON [phone] 11.22.17 N. MORRIS      Radiological Exams on Admission: Portable Chest 1 View  Result Date: 08/28/2016 CLINICAL DATA:  Unresponsive with labored breathing EXAM: PORTABLE CHEST 1 VIEW COMPARISON:  None. FINDINGS: Lungs are clear. Heart size and pulmonary vascularity are normal. No adenopathy. There is atherosclerotic calcification in the aorta. No bone lesions. IMPRESSION: Aortic atherosclerosis.  No edema or consolidation. Electronically Signed   By: Bretta BangWilliam  Woodruff III M.D.   On: 08/28/2016 09:09   Dg Abd Portable 1v  Result Date: 08/28/2016 CLINICAL DATA:  75 year old female with a history of confusion EXAM: PORTABLE ABDOMEN - 1 VIEW COMPARISON:  None. FINDINGS: Minimal gas within small bowel and colon.  No abnormal distention. No radiopaque foreign body. Urinary catheter in position. Surgical changes of cholecystectomy. No unexpected calcification or soft tissue density. Degenerative changes of the spine and bilateral hips. Frontal view demonstrates disruption of the left-sided pectineal line, near the pubic symphysis. No comparison available. IMPRESSION: Nonobstructive bowel gas pattern. Urinary catheter in position. This view demonstrates disruption of the left pectineal line, potentially representing a pelvic fracture. Recommend correlation with pelvic plain film series for further evaluation as there are no comparisons available. Signed, Yvone NeuJaime S. Loreta AveWagner, DO [facility] Electronically Signed   By: Gilmer MorJaime  Wagner D.O.   On: 08/28/2016 09:13     EKG: Independently reviewed.  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 Etiology unknown  Concern for  UTI vs. Metabolic abnormalities vs Meds .  Review of [facility] from [facility] and from Nursing Home notes, show patient to have a  History of recurrent UTIs, recently treated with 3 days of Cipro from 08-18-10/14.  Labs at [facility] show  MRSA by PCr +. CXR negative for acute changes.  WBC 32  sodium 172 potassium 6.5 (slowly improving) chloride 130 bicarb 20; BUN 127 creatinine 4.19, calcium 8.3.Lactic acid 2.0 ar [facility].  CPK 180 (trending down)  and Tn 0.03 ;  Urinalysis was positive for infection.  UDS is negative  Patient given Rocephin, total 4 L of normal saline IV fluid, sodium bicarbonate, insulin, calcium gluconate, and dextrose. She was transferred for continuation of care . Here she is receiving more hydration due to hemoconcentration and AKI .  Admit to telemetry/ SDU/ Inpatient  HIV  RPR ammonia level and TSH IVF with 1 Bolus NS, and 1/2 NS at 125 cc/hr  Urine and blood culture, contact precautions Mg and Phos  Calcium Gluconate IV x1  Novolog 10 U x1  Meropenem per Pharmacy Cycle Tn and EKG   Serial Lactic acid  Check BMET q 6 h, Serum and Urine Na, Serum  and urine Cr   Type II Diabetes with Neuropathy Glu 134  No results found for: HGBA1C Hgb A1C  Lantus  SSI  Continue Neurontin  COPD / OSA   Osats 96 2 L  CXR neg  WBC 32  Continue nebs and O2 prn  Continue CPAP at night   Hypertension BP 117/39  Pulse 75    Controlled anti-hypertensive medications on hold as patient is not able to take PO at this time  Add Hydralazine Q6 hours as needed for BP 160/90    GERD Continue PPI   Hyperlipidemia Continue home statins  Depression and Anxiety  Continue Effexor and Ativan oral in am  Ativan IV tonight   Deconditioning/ History of multiple falls, due to chronic abnormal gait, and peripheral neuropathy  Abd XR without acute findings but with possible pelvic fx .   PT/OT consult     DVT prophylaxis:  Heparin  Code Status:     DNR Family Communication: None  Disposition Plan: To NH when condition improves  Consults called:    None Admission status:  SDU    Selin Eisler E, PA-C [facility]   08/28/2016, 11:59 AM

## 2016-08-28 NOTE — Progress Notes (Signed)
CRITICAL VALUE ALERT  Critical value received:  Sodium 172, Potassium 6.5, Chloride >130  Date of notification:  08/28/2016  Time of notification:  1020  Critical value read back:yes   1034 paged MD Margot AblesMerrill to inform.

## 2016-08-28 NOTE — Progress Notes (Signed)
Pharmacy Antibiotic Note  [name] is a 75 y.o. female admitted on 08/28/2016 with confusion and possible UTI.   Pharmacy has been consulted for meropenem  dosing. WBC 32, Cr 4 acute renal failure possible dehydration monitor improvement and need for does changes as improvement noted with IVF Plan: meropenem 500mg  q12     Temp (24hrs), Avg:97.7 F (36.5 C), Min:96.8 F (36 C), Max:98.6 F (37 C)   Recent Labs Lab 08/28/16 0912 08/28/16 1103  WBC 32.0*  --   CREATININE 4.19*  --   LATICACIDVEN  --  2.5*    CrCl cannot be calculated (Unknown ideal weight.).    Allergies  Allergen Reactions  . Penicillins Rash    No other information available at this time  . Atorvastatin Other (See Comments)    Myalgias - [facility]  . Other Swelling    Throat swelling from melon, cantaloupe and honey dew [facility]    Antimicrobials this admission: 11/22 meropenem>  Dose adjustments this admission:   Microbiology results:  Leota SauersLisa Ifeanyichukwu Wickham Pharm.D. CPP, BCPS Clinical Pharmacist [phone] 08/28/2016 1:55 PM

## 2016-08-28 NOTE — Progress Notes (Signed)
Paged MD Margot AblesMerrill, lactic acid 2.5, Na 175, Potassium 5.8 (blood sugar 79 at 1600), chloride >130, BUN 129, Cr 4.1, UOP 525 for shift.

## 2016-08-29 LAB — BASIC METABOLIC PANEL
BUN: 100 mg/dL — AB (ref 6–20)
BUN: 113 mg/dL — ABNORMAL HIGH (ref 6–20)
BUN: 118 mg/dL — ABNORMAL HIGH (ref 6–20)
BUN: 86 mg/dL — AB (ref 6–20)
BUN: 93 mg/dL — AB (ref 6–20)
CO2: 18 mmol/L — AB (ref 22–32)
CO2: 18 mmol/L — AB (ref 22–32)
CO2: 18 mmol/L — ABNORMAL LOW (ref 22–32)
CO2: 19 mmol/L — AB (ref 22–32)
CO2: 21 mmol/L — AB (ref 22–32)
CREATININE: 2.49 mg/dL — AB (ref 0.44–1.00)
Calcium: 8 mg/dL — ABNORMAL LOW (ref 8.9–10.3)
Calcium: 8.1 mg/dL — ABNORMAL LOW (ref 8.9–10.3)
Calcium: 8.2 mg/dL — ABNORMAL LOW (ref 8.9–10.3)
Calcium: 8.2 mg/dL — ABNORMAL LOW (ref 8.9–10.3)
Calcium: 8.4 mg/dL — ABNORMAL LOW (ref 8.9–10.3)
Chloride: >130 mmol/L (ref 101–111)
Creatinine, Ser: 2.83 mg/dL — ABNORMAL HIGH (ref 0.44–1.00)
Creatinine, Ser: 2.99 mg/dL — ABNORMAL HIGH (ref 0.44–1.00)
Creatinine, Ser: 3.43 mg/dL — ABNORMAL HIGH (ref 0.44–1.00)
Creatinine, Ser: 3.6 mg/dL — ABNORMAL HIGH (ref 0.44–1.00)
GFR calc Af Amer: 16 mL/min — ABNORMAL LOW (ref >60)
GFR calc Af Amer: 18 mL/min — ABNORMAL LOW (ref >60)
GFR calc Af Amer: 21 mL/min — ABNORMAL LOW (ref >60)
GFR calc non Af Amer: 11 mL/min — ABNORMAL LOW (ref >60)
GFR calc non Af Amer: 18 mL/min — ABNORMAL LOW (ref >60)
GFR, EST AFRICAN AMERICAN: 13 mL/min — AB (ref >60)
GFR, EST AFRICAN AMERICAN: 14 mL/min — AB (ref >60)
GFR, EST NON AFRICAN AMERICAN: 12 mL/min — AB (ref >60)
GFR, EST NON AFRICAN AMERICAN: 14 mL/min — AB (ref >60)
GFR, EST NON AFRICAN AMERICAN: 15 mL/min — AB (ref >60)
GLUCOSE: 225 mg/dL — AB (ref 65–99)
GLUCOSE: 229 mg/dL — AB (ref 65–99)
GLUCOSE: 267 mg/dL — AB (ref 65–99)
GLUCOSE: 249 mg/dL — AB (ref 65–99)
Glucose, Bld: 235 mg/dL — ABNORMAL HIGH (ref 65–99)
POTASSIUM: 4.8 mmol/L (ref 3.5–5.1)
POTASSIUM: 5 mmol/L (ref 3.5–5.1)
POTASSIUM: 5.3 mmol/L — AB (ref 3.5–5.1)
POTASSIUM: 5.6 mmol/L — AB (ref 3.5–5.1)
Potassium: 5.1 mmol/L (ref 3.5–5.1)
SODIUM: 168 mmol/L — AB (ref 135–145)
Sodium: 169 mmol/L (ref 135–145)
Sodium: 167 mmol/L (ref 135–145)
Sodium: 165 mmol/L (ref 135–145)
Sodium: 165 mmol/L (ref 135–145)

## 2016-08-29 LAB — URINE CULTURE: Culture: NO GROWTH

## 2016-08-29 LAB — GLUCOSE, CAPILLARY
GLUCOSE-CAPILLARY: 167 mg/dL — AB (ref 65–99)
Glucose-Capillary: 162 mg/dL — ABNORMAL HIGH (ref 65–99)
Glucose-Capillary: 169 mg/dL — ABNORMAL HIGH (ref 65–99)
Glucose-Capillary: 193 mg/dL — ABNORMAL HIGH (ref 65–99)
Glucose-Capillary: 195 mg/dL — ABNORMAL HIGH (ref 65–99)

## 2016-08-29 LAB — HEMOGLOBIN A1C
Hgb A1c MFr Bld: 10.6 % — ABNORMAL HIGH (ref 4.8–5.6)
Mean Plasma Glucose: 258 mg/dL

## 2016-08-29 MED ORDER — SODIUM CHLORIDE 0.9 % IV SOLN
1.0000 g | Freq: Two times a day (BID) | INTRAVENOUS | Status: DC
  Administered 2016-08-29 – 2016-09-01 (×6): 1 g via INTRAVENOUS
  Filled 2016-08-29 (×8): qty 1

## 2016-08-29 MED ORDER — DEXTROSE 5 % IV SOLN
INTRAVENOUS | Status: DC
  Administered 2016-08-29: 100 mL via INTRAVENOUS
  Administered 2016-08-29 – 2016-08-31 (×6): via INTRAVENOUS

## 2016-08-29 MED ORDER — INSULIN ASPART 100 UNIT/ML ~~LOC~~ SOLN
0.0000 [IU] | SUBCUTANEOUS | Status: DC
  Administered 2016-08-29 (×4): 3 [IU] via SUBCUTANEOUS
  Administered 2016-08-30: 2 [IU] via SUBCUTANEOUS
  Administered 2016-08-30 (×2): 3 [IU] via SUBCUTANEOUS
  Administered 2016-08-30 (×2): 2 [IU] via SUBCUTANEOUS
  Administered 2016-08-31: 3 [IU] via SUBCUTANEOUS
  Administered 2016-08-31 – 2016-09-01 (×2): 2 [IU] via SUBCUTANEOUS
  Administered 2016-09-01 (×2): 3 [IU] via SUBCUTANEOUS
  Administered 2016-09-01 – 2016-09-03 (×4): 2 [IU] via SUBCUTANEOUS
  Administered 2016-09-03: 3 [IU] via SUBCUTANEOUS
  Administered 2016-09-03: 2 [IU] via SUBCUTANEOUS

## 2016-08-29 NOTE — Progress Notes (Addendum)
PROGRESS NOTE    Anita Butterynne Betsch  ZOX:[id number] DOB: 1940/10/31 DOA: 08/28/2016 PCP: Feliciana RossettiGRISSO,GREG, MD  Brief Narrative: [name] is a 75 y.o. female with medical history significant for HTN, diabetes, arthritis, dementia, generalized anxiety disorder, COPD, hyperlipidemia, insomnia, history of trigeminal neuralgia, history of major depressive disorders, obstructive sleep apnea,History of recurrent UTIs  Recently treated with 3 days of Cipro from 08-18-10/14 , bedbound patient [facility] nursing [facility] with a 4 day history of confusion. At [place],Na- 176, potassium 8.1, BUN 139, creatinine 4.9, glucose 181, and lactic acid 2.4. Urinalysis was positive for infection. CPK 268. Tn neg  Patient given Rocephin,total 4 L of normal saline IV fluid, sodium bicarbonate, insulin, calcium gluconate, and dextrose.   Assessment & Plan:   Acute Metabolic encephalopathy in a patient with baseline dementia - due to hypernatremia, AKI, sepsis, high dose gabapentin - moans and responds occasionally to verbal stimuli this am -change IVF to d5W at 100cc/hr, Na starting to improve -continue IV meropenem, FU urine Cx and blood Cx -NPO until mentation better  Sepsis -likely urinary source, monitor -continue IV meropenem, FU urine Cx and blood Cx -History of recurrent UTIs, recently treated with 3 days of Cipro from 08-18-10/14.  Type II Diabetes with Neuropathy  -continue Lantus, SSI  AKI -due to sepsis, meds-ACE/NSAIDs -continue IVF, improving -above meds held -baseline creatinine unknown  COPD / OSA    -stable, Continue nebs and O2 prn  Continue CPAP QHS  Hypertension -stable, hydralazine PRN  GERD Continue PPI  Hyperlipidemia Continue home statins  Depression and Anxiety  Continue Effexor   Deconditioning/ History of multiple falls, due to chronic abnormal gait, and peripheral neuropathy  Abd XR without acute findings but with possible pelvic fx  .   -repeat pelvic xrays -Pt/Ot  DVT prophylaxis:  Heparin  Code Status:     DNR Family Communication: None at bedside, left message for niece Liberatore,Carmen Disposition Plan: Back to  SNF when condition improves    Consultants:     Antimicrobials: Meropenem 11/22  Subjective: Remains obtunded, moans Objective: Vitals:   08/29/16 0800 08/29/16 0900 08/29/16 1000 08/29/16 1137  BP: (!) 169/151  (!) 149/66   Pulse: 82 77 77 78  Resp: 17 15 14 19   Temp:    97.6 F (36.4 C)  TempSrc:    Axillary  SpO2: 99% 98% 98% 100%    Intake/Output Summary (Last 24 hours) at 08/29/16 1157 Last data filed at 08/29/16 0900  Gross per 24 hour  Intake           3327.5 ml  Output             1035 ml  Net           2292.5 ml   There were no vitals filed for this visit.  Examination:  General exam: Obtunded, moans, uncomfortable, responds to pain Respiratory system: decreased BS at bases Cardiovascular system: S1 & S2 heard, RRR. No JVD, murmurs, rubs, gallops or clicks. No pedal edema. Gastrointestinal system: Abdomen is nondistended, soft and nontender. No organomegaly or masses felt. Normal bowel sounds heard. Central nervous system:obtudned but non focal Extremities: Symmetric 5 x 5 power. Skin: No rashes, lesions or ulcers Psychiatry: unable to assess   Data Reviewed: I have personally reviewed following labs and imaging studies  CBC:  Recent Labs Lab 08/28/16 0912  WBC 32.0*  NEUTROABS 28.5*  HGB 16.4*  HCT 53.2*  MCV 95.7  PLT 257   Basic  Metabolic Panel:  Recent Labs Lab 08/28/16 0912 08/28/16 1415 08/29/16 0002 08/29/16 0252 08/29/16 0845  NA 172* 175* 168* 169* 167*  K 6.5* 5.8* 5.6* 5.3* 4.8  CL >130* >130* >130* >130* >130*  CO2 20* 22 18* 19* 18*  GLUCOSE 205* 133* 235* 225* 229*  BUN 127* 129* 118* 113* 100*  CREATININE 4.19* 4.10* 3.60* 3.43* 2.99*  CALCIUM 8.3* 8.4* 8.1* 8.4* 8.2*  MG 3.0*  --   --   --   --   PHOS 6.2*  --   --   --    --    GFR: CrCl cannot be calculated (Unknown ideal weight.). Liver Function Tests:  Recent Labs Lab 08/28/16 0912  AST 41  ALT 26  ALKPHOS 77  BILITOT 0.5  PROT 5.7*  ALBUMIN 2.2*   No results for input(s): LIPASE, AMYLASE in the last 168 hours.  Recent Labs Lab 08/28/16 0912  AMMONIA 21   Coagulation Profile:  Recent Labs Lab 08/28/16 0912  INR 1.34   Cardiac Enzymes:  Recent Labs Lab 08/28/16 0912 08/28/16 1415 08/28/16 1923  CKTOTAL 180  --   --   TROPONINI 0.03* 0.13* 0.03*   BNP (last 3 results) No results for input(s): PROBNP in the last 8760 hours. HbA1C:  Recent Labs  08/28/16 0912  HGBA1C 10.6*   CBG:  Recent Labs Lab 08/28/16 1205 08/28/16 1619 08/28/16 2153 08/29/16 0759 08/29/16 1141  GLUCAP 127* 79 171* 162* 169*   Lipid Profile: No results for input(s): CHOL, HDL, LDLCALC, TRIG, CHOLHDL, LDLDIRECT in the last 72 hours. Thyroid Function Tests: No results for input(s): TSH, T4TOTAL, FREET4, T3FREE, THYROIDAB in the last 72 hours. Anemia Panel: No results for input(s): VITAMINB12, FOLATE, FERRITIN, TIBC, IRON, RETICCTPCT in the last 72 hours. Urine analysis:    Component Value Date/Time   COLORURINE YELLOW 08/28/2016 1056   APPEARANCEUR HAZY (A) 08/28/2016 1056   LABSPEC 1.017 08/28/2016 1056   PHURINE 5.0 08/28/2016 1056   GLUCOSEU NEGATIVE 08/28/2016 1056   HGBUR MODERATE (A) 08/28/2016 1056   BILIRUBINUR NEGATIVE 08/28/2016 1056   KETONESUR NEGATIVE 08/28/2016 1056   PROTEINUR NEGATIVE 08/28/2016 1056   NITRITE NEGATIVE 08/28/2016 1056   LEUKOCYTESUR LARGE (A) 08/28/2016 1056   Sepsis Labs: @LABRCNTIP (procalcitonin:4,lacticidven:4)  ) Recent Results (from the past 240 hour(s))  MRSA PCR Screening     Status: Abnormal   Collection Time: 08/28/16  6:44 AM  Result Value Ref Range Status   MRSA by PCR POSITIVE (A) NEGATIVE Final    Comment:        The GeneXpert MRSA Assay (FDA approved for NASAL specimens only),  is one component of a comprehensive MRSA colonization surveillance program. It is not intended to diagnose MRSA infection nor to guide or monitor treatment for MRSA infections. RESULT CALLED TO, READ BACK BY AND VERIFIED WITH: Edison PaceS. JACKSON [phone] 11.22.17 N. MORRIS   Urine culture     Status: None   Collection Time: 08/28/16 10:56 AM  Result Value Ref Range Status   Specimen Description URINE, RANDOM  Final   Special Requests NONE  Final   Culture NO GROWTH  Final   Report Status 08/29/2016 FINAL  Final         Radiology Studies: Portable Chest 1 View  Result Date: 08/28/2016 CLINICAL DATA:  Unresponsive with labored breathing EXAM: PORTABLE CHEST 1 VIEW COMPARISON:  None. FINDINGS: Lungs are clear. Heart size and pulmonary vascularity are normal. No adenopathy. There is atherosclerotic calcification in the aorta.  No bone lesions. IMPRESSION: Aortic atherosclerosis.  No edema or consolidation. Electronically Signed   By: Bretta Bang III M.D.   On: 08/28/2016 09:09   Dg Abd Portable 1v  Result Date: 08/28/2016 CLINICAL DATA:  75 year old female with a history of confusion EXAM: PORTABLE ABDOMEN - 1 VIEW COMPARISON:  None. FINDINGS: Minimal gas within small bowel and colon.  No abnormal distention. No radiopaque foreign body. Urinary catheter in position. Surgical changes of cholecystectomy. No unexpected calcification or soft tissue density. Degenerative changes of the spine and bilateral hips. Frontal view demonstrates disruption of the left-sided pectineal line, near the pubic symphysis. No comparison available. IMPRESSION: Nonobstructive bowel gas pattern. Urinary catheter in position. This view demonstrates disruption of the left pectineal line, potentially representing a pelvic fracture. Recommend correlation with pelvic plain film series for further evaluation as there are no comparisons available. Signed, Yvone Neu. Loreta Ave, DO [facility]  [facility] Electronically Signed   By: Gilmer Mor D.O.   On: 08/28/2016 09:13        Scheduled Meds: . buPROPion  100 mg Oral Daily  . chlorhexidine  15 mL Mouth Rinse BID  . heparin  5,000 Units Subcutaneous Q8H  . insulin aspart  0-15 Units Subcutaneous Q4H  . insulin glargine  32 Units Subcutaneous Daily  . mouth rinse  15 mL Mouth Rinse q12n4p  . meropenem (MERREM) IV  500 mg Intravenous Q12H  . pravastatin  20 mg Oral Daily  . sodium chloride flush  3 mL Intravenous Q12H  . sodium chloride flush  3 mL Intravenous Q12H   Continuous Infusions: . dextrose 100 mL/hr at 08/29/16 0841     LOS: 1 day    Time spent:    Zannie Cove, MD [facility] Pager [phone]  If 7PM-7AM, please contact night-coverage [email or web address] Password [facility] 08/29/2016, 11:57 AM

## 2016-08-29 NOTE — Progress Notes (Signed)
Lab results remain critical but unchanged.  VSS.  Patient resting without distress.  Responds only to pain at this time.  Will continue to monitor.

## 2016-08-29 NOTE — Progress Notes (Signed)
Pharmacy Antibiotic Note  Larinda Butterynne Georgiou is a 75 y.o. female admitted on 08/28/2016 with confusion and possible UTI.   Pharmacy has been consulted for meropenem  dosing. WBC 32, Cr 4 acute renal failure possible dehydration monitor improvement and need for does changes as improvement noted with IVF Cr improved 2.8 CrCl about 5230ml/min  Plan: meropenem 500mg  q12> increase 1gm q12  Weight: 199 lb 1.2 oz (90.3 kg)  Temp (24hrs), Avg:97.7 F (36.5 C), Min:97.5 F (36.4 C), Max:98 F (36.7 C)   Recent Labs Lab 08/28/16 0912 08/28/16 1103 08/28/16 1415 08/28/16 1636 08/29/16 0002 08/29/16 0252 08/29/16 0845 08/29/16 1505  WBC 32.0*  --   --   --   --   --   --   --   CREATININE 4.19*  --  4.10*  --  3.60* 3.43* 2.99* 2.83*  LATICACIDVEN  --  2.5*  --  2.5*  --   --   --   --     CrCl cannot be calculated (Unknown ideal weight.).    Allergies  Allergen Reactions  . Penicillins Rash    No other information available at this time  . Atorvastatin Other (See Comments)    [facility]  . Other Swelling    Throat swelling from melon, cantaloupe and honey dew [facility]    Antimicrobials this admission: 11/22 meropenem>  Dose adjustments this admission: Increase merropenem 1gm q12  Microbiology results:  Leota SauersLisa Tovia Kisner Pharm.D. CPP, BCPS Clinical Pharmacist [phone] 08/29/2016 4:44 PM

## 2016-08-29 NOTE — Progress Notes (Signed)
CRITICAL VALUE ALERT  Critical value received:  NA   Date of notification: 08/29/2016  Time of notification:  0939  Critical value read back:Yes.    Nurse who received alert:  Sophronia SimasMiranda Chenita Ruda RN  MD notified (1st page): Dr Jomarie LongsJoseph  Time of first page: 0939  MD notified (2nd page):  Time of second page:  Responding MD:  Dr Jomarie LongsJoseph   Time MD responded:  [phone]  Marney SettingSurles, Kae HellerMiranda Lynn, RN

## 2016-08-29 NOTE — Progress Notes (Signed)
CRITICAL VALUE ALERT  Critical value received:  Chloride >130  Date of notification:  11/32/2017  Time of notification:  2106    Critical value read back:Yes.    Nurse who received alert:  Annetta MawSara Gonthier  MD notified (1st page): Donnamarie Poagk. Kirby   Time of first page:  2107  MD notified (2nd page): K.Kirbry  Time of second page:  Responding MD:  [id number]  Time MD responded:  Still waiting to hear back @ 2321

## 2016-08-30 ENCOUNTER — Inpatient Hospital Stay (HOSPITAL_COMMUNITY): Payer: Medicare Other

## 2016-08-30 LAB — GLUCOSE, CAPILLARY
GLUCOSE-CAPILLARY: 149 mg/dL — AB (ref 65–99)
GLUCOSE-CAPILLARY: 147 mg/dL — AB (ref 65–99)
GLUCOSE-CAPILLARY: 165 mg/dL — AB (ref 65–99)
GLUCOSE-CAPILLARY: 143 mg/dL — AB (ref 65–99)

## 2016-08-30 LAB — CBC
HEMATOCRIT: 49.1 % — AB (ref 36.0–46.0)
HEMOGLOBIN: 15.2 g/dL — AB (ref 12.0–15.0)
MCH: 28.4 pg (ref 26.0–34.0)
MCHC: 31 g/dL (ref 30.0–36.0)
MCV: 91.8 fL (ref 78.0–100.0)
Platelets: 182 10*3/uL (ref 150–400)
RBC: 5.35 MIL/uL — ABNORMAL HIGH (ref 3.87–5.11)
RDW: 16.2 % — AB (ref 11.5–15.5)
WBC: 24.8 10*3/uL — ABNORMAL HIGH (ref 4.0–10.5)

## 2016-08-30 LAB — BASIC METABOLIC PANEL
BUN: 79 mg/dL — AB (ref 6–20)
CALCIUM: 8.3 mg/dL — AB (ref 8.9–10.3)
CO2: 20 mmol/L — AB (ref 22–32)
Chloride: >130 mmol/L (ref 101–111)
Creatinine, Ser: 2.4 mg/dL — ABNORMAL HIGH (ref 0.44–1.00)
GFR calc Af Amer: 21 mL/min — ABNORMAL LOW (ref >60)
GFR, EST NON AFRICAN AMERICAN: 19 mL/min — AB (ref >60)
GLUCOSE: 215 mg/dL — AB (ref 65–99)
POTASSIUM: 3.8 mmol/L (ref 3.5–5.1)
Sodium: 166 mmol/L (ref 135–145)

## 2016-08-30 NOTE — Evaluation (Signed)
Clinical/Bedside Swallow Evaluation Patient Details  Name: [name] MRN: [id number] Date of Birth: 1941/07/15  Today's Date: 08/30/2016 Time: SLP Start Time (ACUTE ONLY): 1108 SLP Stop Time (ACUTE ONLY): 1125 SLP Time Calculation (min) (ACUTE ONLY): 17 min  Past Medical History:  Past Medical History:  Diagnosis Date  . Anxiety   . Arthritis   . Bladder problem   . Depression   . Diabetes mellitus without complication   . Hypertension   . Swelling    Past Surgical History:  Past Surgical History:  Procedure Laterality Date  . ANKLE SURGERY     RIGHT  . FOOT SURGERY     RIGHT FOOT  . KNEE SURGERY    . SHOULDER SURGERY     HPI:  75 y.o.femalewith medical history significant for HTN,diabetes, arthritis, dementia, generalized anxiety disorder, COPD, hyperlipidemia, insomnia, history of trigeminal neuralgia, history of major depressive disorders, obstructive sleep apnea,History of recurrent UTIs Recently treated with 3 days of Cipro from 08-18-10/14 ,bedbound patient [facility] nursing [facility] with a 4 day history of confusion.  Dx acute metabolic encephalopathy due to hypernatremia, AKI, sepsis   Assessment / Plan / Recommendation Clinical Impression  Pt is alert, but with poor PO readiness - unable to follow commands, nor recognize/anticipate PO trials, leading to oral holding, passive transfer of limited POs to pharynx, and throat-clearing/wet cough after all trials.  Pt is a high aspiration risk.  Rec continued NPO until mental status improves.  SLP will follow for readiness - D/W RN.     Aspiration Risk  Severe aspiration risk    Diet Recommendation   npo Medication Administration: Via alternative means    Other  Recommendations Oral Care Recommendations: Oral care QID   Follow up Recommendations  (tba)      Frequency and Duration min 2x/week  1 week       Prognosis Prognosis for Safe Diet Advancement: Fair      Swallow Study    General Date of Onset: 08/28/16 HPI: 75 y.o.femalewith medical history significant for HTN,diabetes, arthritis, dementia, generalized anxiety disorder, COPD, hyperlipidemia, insomnia, history of trigeminal neuralgia, history of major depressive disorders, obstructive sleep apnea,History of recurrent UTIs Recently treated with 3 days of Cipro from 08-18-10/14 ,bedbound patient [facility] nursing [facility] with a 4 day history of confusion.  Dx acute metabolic encephalopathy due to hypernatremia, AKI, sepsis Type of Study: Bedside Swallow Evaluation Previous Swallow Assessment: no Diet Prior to this Study: NPO Temperature Spikes Noted: No Respiratory Status: Room air History of Recent Intubation: No Behavior/Cognition: Alert;Confused Oral Cavity Assessment: Dried secretions Oral Care Completed by SLP: Recent completion by staff Oral Cavity - Dentition: Adequate natural dentition Self-Feeding Abilities: Needs assist Patient Positioning: Upright in bed Baseline Vocal Quality: Normal Volitional Cough: Cognitively unable to elicit Volitional Swallow: Unable to elicit    Oral/Motor/Sensory Function Overall Oral Motor/Sensory Function:  (symmetry at baseline)   Ice Chips Ice chips: Impaired Presentation: Spoon Oral Phase Impairments: Poor awareness of bolus Oral Phase Functional Implications: Prolonged oral transit;Oral holding Pharyngeal Phase Impairments: Suspected delayed Swallow;Cough - Delayed;Throat Clearing - Delayed   Thin Liquid Thin Liquid: Impaired Oral Phase Impairments: Poor awareness of bolus Oral Phase Functional Implications: Oral holding Pharyngeal  Phase Impairments: Suspected delayed Swallow;Throat Clearing - Immediate    Nectar Thick Nectar Thick Liquid: Not tested   Honey Thick Honey Thick Liquid: Not tested   Puree Puree: Impaired Presentation: Spoon Oral Phase Impairments: Poor awareness of bolus  Oral Phase Functional Implications: Oral  holding Pharyngeal Phase Impairments: Suspected delayed Swallow;Throat Clearing - Delayed   Solid   GO   Solid: Not tested        [name], [name] 08/30/2016,11:32 AM Anita FolksAmanda L. Compton [name], [place] CCC/SLP Pager [phone]

## 2016-08-30 NOTE — Progress Notes (Signed)
PROGRESS NOTE    [name]  ZOX:[id number] DOB: [birth year] DOA: 08/28/2016 PCP: Feliciana Rossetti, MD  Brief Narrative: [name] is a 75 y.o. female with medical history significant for HTN, diabetes, arthritis, dementia, generalized anxiety disorder, COPD, hyperlipidemia, insomnia, history of trigeminal neuralgia, history of major depressive disorders, obstructive sleep apnea,History of recurrent UTIs  Recently treated with 3 days of Cipro from 08-18-10/14 , bedbound patient brought from nursing facility to [facility] with a 4 day history of confusion. At [place],Na- 176, potassium 8.1, BUN 139, creatinine 4.9, glucose 181, and lactic acid 2.4. Urinalysis was positive for infection. CPK 268. Tn neg  Patient given Rocephin,total 4 L of normal saline IV fluid, sodium bicarbonate, insulin, calcium gluconate, and dextrose.   Assessment & Plan:   Acute Metabolic encephalopathy in a patient with baseline dementia - due to hypernatremia, AKI, sepsis, high dose gabapentin - moans and responds to verbal stimuli this am -increase IVF to d5W at 125cc/hr, Na starting to improve very slowly -continue IV meropenem, -NPO -due to mentation, starting to improve but confused, ask SLP to eval  Sepsis -recently treated for UTI, could be partially treated UTI, urine cX negative now -continue IV meropenem today, I wonder if aspiration is the cause now -will repeat CXR -Blood Cx negative -History of recurrent UTIs, recently treated with 3 days of Cipro from 08-18-10/14.  Type II Diabetes with Neuropathy  -continue Lantus, SSI  AKI -due to sepsis, meds-ACE/NSAIDs -continue IVF, improving -above meds held -baseline creatinine unknown  COPD / OSA    -stable, Continue nebs and O2 prn  Continue CPAP QHS  Hypertension -stable, hydralazine PRN  GERD Continue PPI  Hyperlipidemia Continue home statins  Depression and Anxiety  Continue Effexor   Deconditioning/ History of  multiple falls, due to chronic abnormal gait, and peripheral neuropathy  Abd XR without acute findings but with possible pelvic fx .   -repeat pelvic xrays -Pt/Ot  DVT prophylaxis:  Heparin  Code Status:     DNR Family Communication: None at bedside, left message for niece Compton,Anita 11/23, will try again today Disposition Plan: Back to  SNF when condition improves    Consultants:     Antimicrobials: Meropenem 11/22  Subjective: Remains obtunded, moans  Objective: Vitals:   08/30/16 0000 08/30/16 0300 08/30/16 0743 08/30/16 0800  BP:  (!) 143/110 (!) 164/85 (!) 162/55  Pulse: 72 76 75   Resp: 16 13 19 13   Temp:  97.9 F (36.6 C) 97.8 F (36.6 C)   TempSrc:  Oral Oral   SpO2: 100% 100%    Weight:        Intake/Output Summary (Last 24 hours) at 08/30/16 1116 Last data filed at 08/30/16 1000  Gross per 24 hour  Intake           2467.5 ml  Output             2825 ml  Net           -357.5 ml   Filed Weights   08/29/16 1638  Weight: 90.3 kg (199 lb 1.2 oz)    Examination:  General exam: Obtunded, moans, uncomfortable, responds to pain Respiratory system: decreased BS at bases and ronchi too Cardiovascular system: S1 & S2 heard, RRR. No JVD, murmurs, rubs, gallops or clicks. No pedal edema. Gastrointestinal system: Abdomen is nondistended, soft and nontender Central nervous system:obtudned, moans , LUE contracted Extremities: Symmetric 5 x 5 power. Skin: No rashes, lesions or ulcers Psychiatry: unable to assess  Data Reviewed: I have personally reviewed following labs and imaging studies  CBC:  Recent Labs Lab 08/28/16 0912 08/30/16 0112  WBC 32.0* 24.8*  NEUTROABS 28.5*  --   HGB 16.4* 15.2*  HCT 53.2* 49.1*  MCV 95.7 91.8  PLT 257 182   Basic Metabolic Panel:  Recent Labs Lab 08/28/16 0912  08/29/16 0252 08/29/16 0845 08/29/16 1505 08/29/16 2036 08/30/16 0112  NA 172*  < > 169* 167* 165* 165* 166*  K 6.5*  < > 5.3* 4.8 5.0 5.1  3.8  CL >130*  < > >130* >130* >130* >130* >130*  CO2 20*  < > 19* 18* 21* 18* 20*  GLUCOSE 205*  < > 225* 229* 267* 249* 215*  BUN 127*  < > 113* 100* 93* 86* 79*  CREATININE 4.19*  < > 3.43* 2.99* 2.83* 2.49* 2.40*  CALCIUM 8.3*  < > 8.4* 8.2* 8.0* 8.2* 8.3*  MG 3.0*  --   --   --   --   --   --   PHOS 6.2*  --   --   --   --   --   --   < > = values in this interval not displayed. GFR: CrCl cannot be calculated (Unknown ideal weight.). Liver Function Tests:  Recent Labs Lab 08/28/16 0912  AST 41  ALT 26  ALKPHOS 77  BILITOT 0.5  PROT 5.7*  ALBUMIN 2.2*   No results for input(s): LIPASE, AMYLASE in the last 168 hours.  Recent Labs Lab 08/28/16 0912  AMMONIA 21   Coagulation Profile:  Recent Labs Lab 08/28/16 0912  INR 1.34   Cardiac Enzymes:  Recent Labs Lab 08/28/16 0912 08/28/16 1415 08/28/16 1923  CKTOTAL 180  --   --   TROPONINI 0.03* 0.13* 0.03*   BNP (last 3 results) No results for input(s): PROBNP in the last 8760 hours. HbA1C:  Recent Labs  08/28/16 0912  HGBA1C 10.6*   CBG:  Recent Labs Lab 08/29/16 1619 08/29/16 2114 08/29/16 2357 08/30/16 0403 08/30/16 0749  GLUCAP 193* 195* 167* 149* 147*   Lipid Profile: No results for input(s): CHOL, HDL, LDLCALC, TRIG, CHOLHDL, LDLDIRECT in the last 72 hours. Thyroid Function Tests: No results for input(s): TSH, T4TOTAL, FREET4, T3FREE, THYROIDAB in the last 72 hours. Anemia Panel: No results for input(s): VITAMINB12, FOLATE, FERRITIN, TIBC, IRON, RETICCTPCT in the last 72 hours. Urine analysis:    Component Value Date/Time   COLORURINE YELLOW 08/28/2016 1056   APPEARANCEUR HAZY (A) 08/28/2016 1056   LABSPEC 1.017 08/28/2016 1056   PHURINE 5.0 08/28/2016 1056   GLUCOSEU NEGATIVE 08/28/2016 1056   HGBUR MODERATE (A) 08/28/2016 1056   BILIRUBINUR NEGATIVE 08/28/2016 1056   KETONESUR NEGATIVE 08/28/2016 1056   PROTEINUR NEGATIVE 08/28/2016 1056   NITRITE NEGATIVE 08/28/2016 1056    LEUKOCYTESUR LARGE (A) 08/28/2016 1056   Sepsis Labs: @LABRCNTIP (procalcitonin:4,lacticidven:4)  ) Recent Results (from the past 240 hour(s))  MRSA PCR Screening     Status: Abnormal   Collection Time: 08/28/16  6:44 AM  Result Value Ref Range Status   MRSA by PCR POSITIVE (A) NEGATIVE Final    Comment:        The GeneXpert MRSA Assay (FDA approved for NASAL specimens only), is one component of a comprehensive MRSA colonization surveillance program. It is not intended to diagnose MRSA infection nor to guide or monitor treatment for MRSA infections. RESULT CALLED TO, READ BACK BY AND VERIFIED WITH: S. JACKSON [phone] 11.22.17 N. MORRIS  Culture, blood (Routine X 2) w Reflex to ID Panel     Status: None (Preliminary result)   Collection Time: 08/28/16 10:20 AM  Result Value Ref Range Status   Specimen Description BLOOD LEFT ARM  Final   Special Requests IN PEDIATRIC BOTTLE 1.5CC  Final   Culture NO GROWTH 1 DAY  Final   Report Status PENDING  Incomplete  Culture, blood (Routine X 2) w Reflex to ID Panel     Status: None (Preliminary result)   Collection Time: 08/28/16 10:28 AM  Result Value Ref Range Status   Specimen Description BLOOD RIGHT ARM  Final   Special Requests IN PEDIATRIC BOTTLE 2CC  Final   Culture NO GROWTH 1 DAY  Final   Report Status PENDING  Incomplete  Urine culture     Status: None   Collection Time: 08/28/16 10:56 AM  Result Value Ref Range Status   Specimen Description URINE, RANDOM  Final   Special Requests NONE  Final   Culture NO GROWTH  Final   Report Status 08/29/2016 FINAL  Final         Radiology Studies: Dg Chest 2 View  Result Date: 08/30/2016 CLINICAL DATA:  Sepsis EXAM: CHEST  2 VIEW COMPARISON:  None. FINDINGS: Cardiomediastinal silhouette is stable. No acute infiltrate or pleural effusion. No pulmonary edema. Degenerative changes bilateral shoulders. IMPRESSION: No active cardiopulmonary disease. Electronically Signed   By: Natasha MeadLiviu   Pop M.D.   On: 08/30/2016 10:17        Scheduled Meds: . chlorhexidine  15 mL Mouth Rinse BID  . heparin  5,000 Units Subcutaneous Q8H  . insulin aspart  0-15 Units Subcutaneous Q4H  . insulin glargine  32 Units Subcutaneous Daily  . mouth rinse  15 mL Mouth Rinse q12n4p  . meropenem (MERREM) IV  1 g Intravenous Q12H  . pravastatin  20 mg Oral Daily  . sodium chloride flush  3 mL Intravenous Q12H  . sodium chloride flush  3 mL Intravenous Q12H   Continuous Infusions: . dextrose 125 mL/hr at 08/30/16 0831     LOS: 2 days    Time spent: 35min    Zannie CovePreetha Nariyah Osias, MD [facility] Pager [phone]  If 7PM-7AM, please contact night-coverage [email or web address] Password [email or web address] 08/30/2016, 11:16 AM

## 2016-08-31 ENCOUNTER — Inpatient Hospital Stay (HOSPITAL_COMMUNITY): Payer: Medicare Other

## 2016-08-31 DIAGNOSIS — R269 Unspecified abnormalities of gait and mobility: Secondary | ICD-10-CM

## 2016-08-31 LAB — BASIC METABOLIC PANEL
BUN: 33 mg/dL — AB (ref 6–20)
CO2: 22 mmol/L (ref 22–32)
Calcium: 7.8 mg/dL — ABNORMAL LOW (ref 8.9–10.3)
Creatinine, Ser: 1.29 mg/dL — ABNORMAL HIGH (ref 0.44–1.00)
GFR calc Af Amer: 45 mL/min — ABNORMAL LOW (ref >60)
GFR calc non Af Amer: 39 mL/min — ABNORMAL LOW (ref >60)
Glucose, Bld: 187 mg/dL — ABNORMAL HIGH (ref 65–99)
POTASSIUM: 2.9 mmol/L — AB (ref 3.5–5.1)
SODIUM: 162 mmol/L — AB (ref 135–145)

## 2016-08-31 LAB — GLUCOSE, CAPILLARY
GLUCOSE-CAPILLARY: 102 mg/dL — AB (ref 65–99)
GLUCOSE-CAPILLARY: 148 mg/dL — AB (ref 65–99)
GLUCOSE-CAPILLARY: 172 mg/dL — AB (ref 65–99)
GLUCOSE-CAPILLARY: 192 mg/dL — AB (ref 65–99)
GLUCOSE-CAPILLARY: 75 mg/dL (ref 65–99)
GLUCOSE-CAPILLARY: 76 mg/dL (ref 65–99)
Glucose-Capillary: 108 mg/dL — ABNORMAL HIGH (ref 65–99)
Glucose-Capillary: 91 mg/dL (ref 65–99)

## 2016-08-31 LAB — CBC
HEMATOCRIT: 47.6 % — AB (ref 36.0–46.0)
HEMOGLOBIN: 14.7 g/dL (ref 12.0–15.0)
MCH: 28.1 pg (ref 26.0–34.0)
MCHC: 30.9 g/dL (ref 30.0–36.0)
MCV: 91 fL (ref 78.0–100.0)
Platelets: 135 10*3/uL — ABNORMAL LOW (ref 150–400)
RBC: 5.23 MIL/uL — AB (ref 3.87–5.11)
RDW: 15.8 % — ABNORMAL HIGH (ref 11.5–15.5)
WBC: 18.2 10*3/uL — ABNORMAL HIGH (ref 4.0–10.5)

## 2016-08-31 MED ORDER — POTASSIUM CHLORIDE CRYS ER 20 MEQ PO TBCR: 40.0000 meq | EXTENDED_RELEASE_TABLET | Freq: Three times a day (TID) | ORAL | Status: DC

## 2016-08-31 MED ORDER — POTASSIUM CHLORIDE 2 MEQ/ML IV SOLN
INTRAVENOUS | Status: DC
  Administered 2016-08-31 – 2016-09-01 (×3): via INTRAVENOUS
  Filled 2016-08-31 (×5): qty 1000

## 2016-08-31 NOTE — Progress Notes (Signed)
Patient tongue and teeth coated with thick yellowish tan layer. Mouth care performed and vigorously scrubbed with brush but could not get off tongue or back teeth. Was able to get off lips and front teeth. Charge nurse also made second attempt but was unsuccessful.

## 2016-08-31 NOTE — Progress Notes (Signed)
CPAP is contraindicated at this time. Pt is unable to follow commands and has dementia.   Per Speech Pathology note:   "PO trials, leading to oral holding, passive transfer of limited POs to pharynx, and throat-clearing/wet cough after all trials.  Pt is a high aspiration risk".

## 2016-08-31 NOTE — Evaluation (Signed)
Occupational Therapy Evaluation Patient Details Name: [name] MRN: [id number] DOB: 10-15-1940 Today's Date: 08/31/2016    History of Present Illness Pt admitted with acute encephalopathy, sepsis and AKI. Pt with baseline dementia and multiple co morbidities.   Clinical Impression   Pt is bed bound and a resident of a SNF. Likely requires total assist for all ADL and mobility. Performed B UE gentle ROM within pt's tolerance and repositioned. No acute OT needs.     Follow Up Recommendations  SNF;Supervision/Assistance - 24 hour    Equipment Recommendations       Recommendations for Other Services       Precautions / Restrictions Precautions Precautions: Fall Restrictions Weight Bearing Restrictions: No      Mobility Bed Mobility Overal bed mobility: +2 for physical assistance;Needs Assistance Bed Mobility: Rolling Rolling: +2 for physical assistance;Total assist            Transfers                      Balance                                            ADL                                         General ADL Comments: Total dependence     Vision     Perception     Praxis      Pertinent Vitals/Pain Pain Assessment: Faces Faces Pain Scale: Hurts even more Pain Location: L hand with ROM Pain Descriptors / Indicators: Guarding;Grimacing;Moaning Pain Intervention(s): Repositioned     Hand Dominance Right   Extremity/Trunk Assessment Upper Extremity Assessment Upper Extremity Assessment: RUE deficits/detail;LUE deficits/detail RUE Deficits / Details: increased tone with PROM, but able to gain full range with gentle stretching RUE Coordination: decreased fine motor;decreased gross motor LUE Deficits / Details: increased tone with PROM, able to range shoulder to 90, full wrist and elbow, flexor contractures in fingers with indicators of pain with ROM, skin intact in palm LUE Coordination: decreased  fine motor;decreased gross motor           Communication Communication Communication: Expressive difficulties (no intelligible speech, moans to indicate discomfort)   Cognition Arousal/Alertness: Awake/alert Behavior During Therapy: Flat affect Overall Cognitive Status: No family/caregiver present to determine baseline cognitive functioning                     General Comments       Exercises       Shoulder Instructions      Home Living Family/patient expects to be discharged to:: Skilled nursing facility                                 Additional Comments: Pt is a resident of a SNF.      Prior Functioning/Environment Level of Independence: Needs assistance  Gait / Transfers Assistance Needed: bed bound ADL's / Homemaking Assistance Needed: Likely dependent in all areas.            OT Problem List: Decreased range of motion;Pain;Decreased cognition;Decreased coordination;Impaired balance (sitting and/or standing);Decreased activity tolerance;Decreased strength   OT Treatment/Interventions:  OT Goals(Current goals can be found in the care plan section) Acute Rehab OT Goals Patient Stated Goal: unable to state  OT Frequency:     Barriers to D/C:            Co-evaluation              End of Session    Activity Tolerance: Patient tolerated treatment well Patient left: in bed;with call bell/phone within reach;with bed alarm set   Time: 1645-1700 OT Time Calculation (min): 15 min Charges:  OT General Charges $OT Visit: 1 Procedure OT Evaluation $OT Eval Moderate Complexity: 1 Procedure G-Codes:    Evern BioMayberry, Shaunika Italiano Lynn 08/31/2016, 5:15 PM  [phone]

## 2016-08-31 NOTE — Progress Notes (Signed)
Speech Language Pathology Treatment: Dysphagia  Patient Details Name: [name] MRN: [id number] DOB: [birth year] Today's Date: 08/31/2016 Time: [id number] SLP Time Calculation (min) (ACUTE ONLY): 18 min  Assessment / Plan / Recommendation Clinical Impression  Pt more responsive to Po trials today, provided visual verbal andatactile cueing; pt responded by appropriately accepting sips of water, though oral control poor. Anterior spillage, delayed swallow and suspected penetration/aspiration observed with liquids x2. Pt also accepted puree x1 with no signs of aspiration observed, though she spit out further trials and would not accept any more attempts with PO. Recommend ongoing NPO status given mental status, decreased awareness of PO and possible dysphagia.    HPI HPI: 75 y.o.femalewith medical history significant for HTN,diabetes, arthritis, dementia, generalized anxiety disorder, COPD, hyperlipidemia, insomnia, history of trigeminal neuralgia, history of major depressive disorders, obstructive sleep apnea,History of recurrent UTIs Recently treated with 3 days of Cipro from 08-18-10/14 ,bedbound [facility] with a 4 day history of confusion.  Dx acute metabolic encephalopathy due to hypernatremia, AKI, sepsis      SLP Plan  Continue with current plan of care     Recommendations  Medication Administration: Via alternative means                Plan: Continue with current plan of care       GO               [facility] Medical CenterBonnie Whitaker Holder[place], MA CCC-SLP [phone]  Claudine MoutonDeBlois, Edwardo Wojnarowski Caroline 08/31/2016, 11:36 AM

## 2016-08-31 NOTE — Progress Notes (Addendum)
PROGRESS NOTE    [name]  ZOX:[id number] DOB: [birth year] DOA: 08/28/2016 PCP: Feliciana Rossetti, MD  Brief Narrative: [name] is a 75 y.o. female with medical history significant for HTN, diabetes, arthritis, dementia, generalized anxiety disorder, COPD, hyperlipidemia, insomnia, history of trigeminal neuralgia, history of major depressive disorders, obstructive sleep apnea,History of recurrent UTIs  Recently treated with 3 days of Cipro from 08-18-10/14 , bedbound patient brought from nursing facility to [facility] with a 4 day history of lethargy Found to have VRE UTI, AKI, Hypernatremia Treating all above, mentation improving but confused, NPO pending SLP Palliative consulted  Assessment & Plan:   Acute Metabolic encephalopathy in a patient with baseline dementia - due to hypernatremia, AKI, sepsis, high dose gabapentin - more alert, but does not answer questions, moans - slow improvement, cut down IVF to 1/2 NS -Stop IV Meropenem, start IV Zyvox-urine cx from [place] with VRE -NPO -due to mentation, starting to improve but confused,  -SLP following, to reassess today  Sepsis due to VRE UTI -I called [facility]: urine cx with 100K VRE, they will fax report -stop meropenem, start IV Zyvox -repeat CXR negative for acute findings -Blood Cx negative -History of recurrent UTIs, recently treated with 3 days of Cipro from 08-18-10/14.  Type II Diabetes with Neuropathy  -continue Lantus, SSI  AKI -due to sepsis, meds-ACE/NSAIDs -cut down IVF improving  Dementia  COPD / OSA    -stable, Continue nebs and O2 prn  Continue CPAP QHS  Hypertension -stable, hydralazine PRN  GERD Continue PPI  Hypokalemia -replace  Depression and Anxiety  Continue Effexor   Deconditioning/ History of multiple falls, due to chronic abnormal gait, and peripheral neuropathy -PT/OT consulted -SNF with possible hospice  ETHICS: discussed with niece about poor  prognosis and recommended hospice at discharge, Palliative consulted for goals of care  DVT prophylaxis:  Heparin  Code Status:     DNR Family Communication: None at bedside, called and d/w niece Liberatore,Carmen 11/25 and 11/26,  discussed poor prognosis  Disposition Plan: Back to  SNF when condition improves    Consultants:     Antimicrobials: Meropenem 11/22  Subjective: Alert , moans, does not answer questions  Objective: Vitals:   08/31/16 0800 08/31/16 0804 08/31/16 1200 08/31/16 1232  BP: 133/76 133/76 (!) 122/91 138/73  Pulse: 74  73   Resp: 17  15   Temp:  97.5 F (36.4 C)  98.5 F (36.9 C)  TempSrc:  Axillary  Axillary  SpO2: 99%  99%   Weight:        Intake/Output Summary (Last 24 hours) at 08/31/16 1236 Last data filed at 08/31/16 1200  Gross per 24 hour  Intake          3548.34 ml  Output             3250 ml  Net           298.34 ml   Filed Weights   08/29/16 1638  Weight: 90.3 kg (199 lb 1.2 oz)    Examination:  General exam: Obtunded, moans, uncomfortable, responds to pain Respiratory system: decreased BS at bases and ronchi too Cardiovascular system: S1 & S2 heard, RRR. No JVD, murmurs, rubs, gallops or clicks. No pedal edema. Gastrointestinal system: Abdomen is nondistended, soft and nontender Central nervous system:obtudned, moans , LUE contracted Extremities: Symmetric 5 x 5 power. Skin: No rashes, lesions or ulcers Psychiatry: unable to assess   Data Reviewed: I have personally reviewed following labs and  imaging studies  CBC:  Recent Labs Lab 08/28/16 0912 08/30/16 0112 08/31/16 0753  WBC 32.0* 24.8* 18.2*  NEUTROABS 28.5*  --   --   HGB 16.4* 15.2* 14.7  HCT 53.2* 49.1* 47.6*  MCV 95.7 91.8 91.0  PLT 257 182 135*   Basic Metabolic Panel:  Recent Labs Lab 08/28/16 0912  08/29/16 0845 08/29/16 1505 08/29/16 2036 08/30/16 0112 08/31/16 0753  NA 172*  < > 167* 165* 165* 166* 162*  K 6.5*  < > 4.8 5.0 5.1 3.8 2.9*    CL >130*  < > >130* >130* >130* >130* >130*  CO2 20*  < > 18* 21* 18* 20* 22  GLUCOSE 205*  < > 229* 267* 249* 215* 187*  BUN 127*  < > 100* 93* 86* 79* 33*  CREATININE 4.19*  < > 2.99* 2.83* 2.49* 2.40* 1.29*  CALCIUM 8.3*  < > 8.2* 8.0* 8.2* 8.3* 7.8*  MG 3.0*  --   --   --   --   --   --   PHOS 6.2*  --   --   --   --   --   --   < > = values in this interval not displayed. GFR: CrCl cannot be calculated (Unknown ideal weight.). Liver Function Tests:  Recent Labs Lab 08/28/16 0912  AST 41  ALT 26  ALKPHOS 77  BILITOT 0.5  PROT 5.7*  ALBUMIN 2.2*   No results for input(s): LIPASE, AMYLASE in the last 168 hours.  Recent Labs Lab 08/28/16 0912  AMMONIA 21   Coagulation Profile:  Recent Labs Lab 08/28/16 0912  INR 1.34   Cardiac Enzymes:  Recent Labs Lab 08/28/16 0912 08/28/16 1415 08/28/16 1923  CKTOTAL 180  --   --   TROPONINI 0.03* 0.13* 0.03*   BNP (last 3 results) No results for input(s): PROBNP in the last 8760 hours. HbA1C: No results for input(s): HGBA1C in the last 72 hours. CBG:  Recent Labs Lab 08/30/16 2032 08/31/16 0038 08/31/16 0424 08/31/16 0800 08/31/16 1228  GLUCAP 143* 102* 108* 148* 192*   Lipid Profile: No results for input(s): CHOL, HDL, LDLCALC, TRIG, CHOLHDL, LDLDIRECT in the last 72 hours. Thyroid Function Tests: No results for input(s): TSH, T4TOTAL, FREET4, T3FREE, THYROIDAB in the last 72 hours. Anemia Panel: No results for input(s): VITAMINB12, FOLATE, FERRITIN, TIBC, IRON, RETICCTPCT in the last 72 hours. Urine analysis:    Component Value Date/Time   COLORURINE YELLOW 08/28/2016 1056   APPEARANCEUR HAZY (A) 08/28/2016 1056   LABSPEC 1.017 08/28/2016 1056   PHURINE 5.0 08/28/2016 1056   GLUCOSEU NEGATIVE 08/28/2016 1056   HGBUR MODERATE (A) 08/28/2016 1056   BILIRUBINUR NEGATIVE 08/28/2016 1056   KETONESUR NEGATIVE 08/28/2016 1056   PROTEINUR NEGATIVE 08/28/2016 1056   NITRITE NEGATIVE 08/28/2016 1056    LEUKOCYTESUR LARGE (A) 08/28/2016 1056   Sepsis Labs: @LABRCNTIP (procalcitonin:4,lacticidven:4)  ) Recent Results (from the past 240 hour(s))  MRSA PCR Screening     Status: Abnormal   Collection Time: 08/28/16  6:44 AM  Result Value Ref Range Status   MRSA by PCR POSITIVE (A) NEGATIVE Final    Comment:        The GeneXpert MRSA Assay (FDA approved for NASAL specimens only), is one component of a comprehensive MRSA colonization surveillance program. It is not intended to diagnose MRSA infection nor to guide or monitor treatment for MRSA infections. RESULT CALLED TO, READ BACK BY AND VERIFIED WITH: S. JACKSON [phone] 11.22.17 N. MORRIS  Culture, blood (Routine X 2) w Reflex to ID Panel     Status: None (Preliminary result)   Collection Time: 08/28/16 10:20 AM  Result Value Ref Range Status   Specimen Description BLOOD LEFT ARM  Final   Special Requests IN PEDIATRIC BOTTLE 1.5CC  Final   Culture NO GROWTH 2 DAYS  Final   Report Status PENDING  Incomplete  Culture, blood (Routine X 2) w Reflex to ID Panel     Status: None (Preliminary result)   Collection Time: 08/28/16 10:28 AM  Result Value Ref Range Status   Specimen Description BLOOD RIGHT ARM  Final   Special Requests IN PEDIATRIC BOTTLE 2CC  Final   Culture NO GROWTH 2 DAYS  Final   Report Status PENDING  Incomplete  Urine culture     Status: None   Collection Time: 08/28/16 10:56 AM  Result Value Ref Range Status   Specimen Description URINE, RANDOM  Final   Special Requests NONE  Final   Culture NO GROWTH  Final   Report Status 08/29/2016 FINAL  Final         Radiology Studies: Dg Chest 2 View  Result Date: 08/30/2016 CLINICAL DATA:  Sepsis EXAM: CHEST  2 VIEW COMPARISON:  None. FINDINGS: Cardiomediastinal silhouette is stable. No acute infiltrate or pleural effusion. No pulmonary edema. Degenerative changes bilateral shoulders. IMPRESSION: No active cardiopulmonary disease. Electronically Signed   By: Natasha MeadLiviu   Pop M.D.   On: 08/30/2016 10:17   Ct Head Wo Contrast  Result Date: 08/30/2016 CLINICAL DATA:  Four days of confusion EXAM: CT HEAD WITHOUT CONTRAST TECHNIQUE: Contiguous axial images were obtained from the base of the skull through the vertex without intravenous contrast. COMPARISON:  None. FINDINGS: Brain: No evidence of acute infarction, hemorrhage, hydrocephalus, extra-axial collection or mass lesion/mass effect. Diffuse atrophic change and chronic white matter ischemic change are seen. Vascular: No hyperdense vessel or unexpected calcification. Skull: Normal. Negative for fracture or focal lesion. Sinuses/Orbits: No acute finding. Other: None. IMPRESSION: Chronic atrophic and ischemic changes without acute abnormality. Electronically Signed   By: Alcide CleverMark  Lukens M.D.   On: 08/30/2016 13:08        Scheduled Meds: . chlorhexidine  15 mL Mouth Rinse BID  . heparin  5,000 Units Subcutaneous Q8H  . insulin aspart  0-15 Units Subcutaneous Q4H  . insulin glargine  32 Units Subcutaneous Daily  . mouth rinse  15 mL Mouth Rinse q12n4p  . meropenem (MERREM) IV  1 g Intravenous Q12H  . pravastatin  20 mg Oral Daily  . sodium chloride flush  3 mL Intravenous Q12H  . sodium chloride flush  3 mL Intravenous Q12H   Continuous Infusions: . dextrose 5 % 1,000 mL with potassium chloride 40 mEq infusion       LOS: 3 days    Time spent: 35min    Zannie CovePreetha Calvert Charland, MD [facility] Pager [phone]  If 7PM-7AM, please contact night-coverage [email or web address] Password [email or web address] 08/31/2016, 12:36 PM

## 2016-09-01 LAB — GLUCOSE, CAPILLARY
GLUCOSE-CAPILLARY: 82 mg/dL (ref 65–99)
GLUCOSE-CAPILLARY: 85 mg/dL (ref 65–99)
Glucose-Capillary: 146 mg/dL — ABNORMAL HIGH (ref 65–99)
Glucose-Capillary: 179 mg/dL — ABNORMAL HIGH (ref 65–99)
Glucose-Capillary: 177 mg/dL — ABNORMAL HIGH (ref 65–99)
Glucose-Capillary: 170 mg/dL — ABNORMAL HIGH (ref 65–99)
Glucose-Capillary: 132 mg/dL — ABNORMAL HIGH (ref 65–99)

## 2016-09-01 LAB — BASIC METABOLIC PANEL
Anion gap: 10 (ref 5–15)
BUN: 13 mg/dL (ref 6–20)
CHLORIDE: 111 mmol/L (ref 101–111)
CO2: 24 mmol/L (ref 22–32)
CREATININE: 0.86 mg/dL (ref 0.44–1.00)
Calcium: 6.8 mg/dL — ABNORMAL LOW (ref 8.9–10.3)
GFR calc Af Amer: >60 mL/min (ref >60)
GFR calc non Af Amer: >60 mL/min (ref >60)
GLUCOSE: 198 mg/dL — AB (ref 65–99)
Potassium: 2.5 mmol/L — CL (ref 3.5–5.1)
Sodium: 145 mmol/L (ref 135–145)

## 2016-09-01 LAB — CBC
HEMATOCRIT: 45.7 % (ref 36.0–46.0)
HEMOGLOBIN: 14.8 g/dL (ref 12.0–15.0)
MCH: 28.3 pg (ref 26.0–34.0)
MCHC: 32.4 g/dL (ref 30.0–36.0)
MCV: 87.4 fL (ref 78.0–100.0)
Platelets: 128 10*3/uL — ABNORMAL LOW (ref 150–400)
RBC: 5.23 MIL/uL — ABNORMAL HIGH (ref 3.87–5.11)
RDW: 14.6 % (ref 11.5–15.5)
WBC: 16 10*3/uL — ABNORMAL HIGH (ref 4.0–10.5)

## 2016-09-01 MED ORDER — POTASSIUM CHLORIDE 2 MEQ/ML IV SOLN
30.0000 meq | Freq: Once | INTRAVENOUS | Status: AC
  Administered 2016-09-01: 30 meq via INTRAVENOUS
  Filled 2016-09-01: qty 15

## 2016-09-01 MED ORDER — SODIUM CHLORIDE 0.9 % IV SOLN
30.0000 meq | Freq: Once | INTRAVENOUS | Status: AC
  Administered 2016-09-01: 30 meq via INTRAVENOUS
  Filled 2016-09-01: qty 15

## 2016-09-01 MED ORDER — LINEZOLID 600 MG/300ML IV SOLN
600.0000 mg | Freq: Two times a day (BID) | INTRAVENOUS | Status: DC
  Administered 2016-09-01 – 2016-09-03 (×5): 600 mg via INTRAVENOUS
  Filled 2016-09-01 (×6): qty 300

## 2016-09-01 MED ORDER — SODIUM CHLORIDE 0.45 % IV SOLN
INTRAVENOUS | Status: DC
  Administered 2016-09-01 – 2016-09-02 (×2): via INTRAVENOUS

## 2016-09-01 NOTE — Progress Notes (Signed)
Speech Language Pathology Treatment: Dysphagia  Patient Details Name: [name] MRN: [id number] DOB: [birth year] Today's Date: 09/01/2016 Time: [id number] SLP Time Calculation (min) (ACUTE ONLY): 10 min  Assessment / Plan / Recommendation Clinical Impression  Pt continues to show subtle improvements, but with mentation still limiting her ability to safely take POs. She has improved oral containment with straw sips of thin liquids, but oral holding is noted across consistencies. Mod cues provided for swallow initiation. After few trials, she started to decline further intake. No overt s/s of aspiration were noted with liquids or purees administered. Recommend to remain NPO but to allow meds crushed in puree as mentation allows. SLP to f/u for PO readiness.   HPI HPI: 75 y.o.femalewith medical history significant for HTN,diabetes, arthritis, dementia, generalized anxiety disorder, COPD, hyperlipidemia, insomnia, history of trigeminal neuralgia, history of major depressive disorders, obstructive sleep apnea,History of recurrent UTIs Recently treated with 3 days of Cipro from 08-18-10/14 ,[facility] with a 4 day history of confusion.  Dx acute metabolic encephalopathy due to hypernatremia, AKI, sepsis      SLP Plan  Continue with current plan of care     Recommendations  Diet recommendations: NPO Medication Administration: Crushed with puree                Oral Care Recommendations: Oral care QID Follow up Recommendations:  (tba) Plan: Continue with current plan of care       GO                Maxcine Hamaiewonsky, Courtnei Ruddell 09/01/2016, 2:32 PM  Maxcine HamLaura Paiewonsky, M.A. CCC-SLP [phone]

## 2016-09-02 DIAGNOSIS — E088 Diabetes mellitus due to underlying condition with unspecified complications: Secondary | ICD-10-CM

## 2016-09-02 DIAGNOSIS — I10 Essential (primary) hypertension: Secondary | ICD-10-CM

## 2016-09-02 DIAGNOSIS — E1142 Type 2 diabetes mellitus with diabetic polyneuropathy: Secondary | ICD-10-CM

## 2016-09-02 DIAGNOSIS — G500 Trigeminal neuralgia: Secondary | ICD-10-CM

## 2016-09-02 DIAGNOSIS — F331 Major depressive disorder, recurrent, moderate: Secondary | ICD-10-CM

## 2016-09-02 DIAGNOSIS — F411 Generalized anxiety disorder: Secondary | ICD-10-CM

## 2016-09-02 DIAGNOSIS — E785 Hyperlipidemia, unspecified: Secondary | ICD-10-CM

## 2016-09-02 LAB — GLUCOSE, CAPILLARY
GLUCOSE-CAPILLARY: 36 mg/dL — AB (ref 65–99)
GLUCOSE-CAPILLARY: 106 mg/dL — AB (ref 65–99)
GLUCOSE-CAPILLARY: 118 mg/dL — AB (ref 65–99)
Glucose-Capillary: 50 mg/dL — ABNORMAL LOW (ref 65–99)
Glucose-Capillary: 96 mg/dL (ref 65–99)
Glucose-Capillary: 145 mg/dL — ABNORMAL HIGH (ref 65–99)
Glucose-Capillary: 150 mg/dL — ABNORMAL HIGH (ref 65–99)

## 2016-09-02 LAB — BASIC METABOLIC PANEL
ANION GAP: 8 (ref 5–15)
BUN: 11 mg/dL (ref 6–20)
CHLORIDE: 115 mmol/L — AB (ref 101–111)
CO2: 29 mmol/L (ref 22–32)
Calcium: 7.1 mg/dL — ABNORMAL LOW (ref 8.9–10.3)
Creatinine, Ser: 0.95 mg/dL (ref 0.44–1.00)
GFR calc Af Amer: >60 mL/min (ref >60)
GFR, EST NON AFRICAN AMERICAN: 57 mL/min — AB (ref >60)
GLUCOSE: 186 mg/dL — AB (ref 65–99)
POTASSIUM: 3.5 mmol/L (ref 3.5–5.1)
SODIUM: 152 mmol/L — AB (ref 135–145)

## 2016-09-02 LAB — CULTURE, BLOOD (ROUTINE X 2)
CULTURE: NO GROWTH
Culture: NO GROWTH

## 2016-09-02 LAB — CBC
HEMATOCRIT: 48.5 % — AB (ref 36.0–46.0)
HEMOGLOBIN: 15.7 g/dL — AB (ref 12.0–15.0)
MCH: 28.7 pg (ref 26.0–34.0)
MCHC: 32.4 g/dL (ref 30.0–36.0)
MCV: 88.7 fL (ref 78.0–100.0)
Platelets: 122 10*3/uL — ABNORMAL LOW (ref 150–400)
RBC: 5.47 MIL/uL — ABNORMAL HIGH (ref 3.87–5.11)
RDW: 14.5 % (ref 11.5–15.5)
WBC: 13.5 10*3/uL — AB (ref 4.0–10.5)

## 2016-09-02 MED ORDER — DEXTROSE 50 % IV SOLN
INTRAVENOUS | Status: AC
  Administered 2016-09-02: 25 mL
  Filled 2016-09-02: qty 50

## 2016-09-02 MED ORDER — PANTOPRAZOLE SODIUM 40 MG IV SOLR
40.0000 mg | Freq: Every day | INTRAVENOUS | Status: DC
  Administered 2016-09-02 – 2016-09-03 (×2): 40 mg via INTRAVENOUS
  Filled 2016-09-02 (×2): qty 40

## 2016-09-02 MED ORDER — MUPIROCIN 2 % EX OINT
TOPICAL_OINTMENT | Freq: Two times a day (BID) | CUTANEOUS | Status: DC
  Administered 2016-09-02 – 2016-09-03 (×3): via NASAL
  Filled 2016-09-02: qty 22

## 2016-09-02 MED ORDER — CHLORHEXIDINE GLUCONATE CLOTH 2 % EX PADS
6.0000 | MEDICATED_PAD | Freq: Every day | CUTANEOUS | Status: DC
  Administered 2016-09-02 – 2016-09-03 (×2): 6 via TOPICAL

## 2016-09-02 MED ORDER — KCL IN DEXTROSE-NACL 10-5-0.45 MEQ/L-%-% IV SOLN
INTRAVENOUS | Status: DC
  Administered 2016-09-02 – 2016-09-03 (×2): via INTRAVENOUS
  Filled 2016-09-02 (×3): qty 1000

## 2016-09-02 MED ORDER — DEXTROSE 50 % IV SOLN
INTRAVENOUS | Status: AC
  Administered 2016-09-02: 50 mL
  Filled 2016-09-02: qty 50

## 2016-09-02 NOTE — Progress Notes (Signed)
Thank you for consulting the Palliative Medicine Team [facility] to meet your patient's and family's needs.   The reason that you asked us to see your patient is  For Establish GOC  We have scheduled your patient for a meeting: tomorrow at 1100  The Surrogate decision make is: Carmen Liberatore/neice  Lorinda CreedMary Katianna Mcclenney NP  Palliative Medicine Team Team Phone # [phone] Pager [phone]   No charge

## 2016-09-02 NOTE — Progress Notes (Signed)
Hypoglycemic Event  CBG: 50  Treatment: D50 IV 25 mL  Symptoms: None  Follow-up CBG: Time:0503 CBG Result:36  Treatment: D50 IV 50 mL  Follow up CBG:  Time: 0525    CBG Result: 96  Possible Reasons for Event: Inadequate meal intake  Comments/MD notified:Provider on-call, K. Sofia notified    [name]

## 2016-09-02 NOTE — Progress Notes (Signed)
Inpatient Diabetes Program Recommendations  AACE/ADA: New Consensus Statement on Inpatient Glycemic Control (2015)  Target Ranges:  Prepandial:   less than 140 mg/dL      Peak postprandial:   less than 180 mg/dL (1-2 hours)      Critically ill patients:  140 - 180 mg/dL   Results for Anita ButterySCHUSTER, Allia (MRN [id number]) as of 09/02/2016 10:59  Ref. Range 09/01/2016 13:19 09/01/2016 16:39 09/01/2016 20:01 09/01/2016 23:50 09/02/2016 04:32 09/02/2016 05:02 09/02/2016 05:24 09/02/2016 07:37  Glucose-Capillary Latest Ref Range: 65 - 99 mg/dL 409170 (H) 811132 (H) 82 85 50 (L) 36 (LL) 96 145 (H)   Review of Glycemic Control  Diabetes history: DM 2 Outpatient Diabetes medications: Lantus 64 units Current orders for Inpatient glycemic control: Lantus 32 units, Novolog Moderate Q4 hours.  Inpatient Diabetes Program Recommendations:   If patient is eating adjust frequency of Novolog Correction to ACHS. Patient also had hypoglycemia this am, 36 mg/dl. Please decrease Lantus to 25 units Daily.  Thanks,  Christena DeemShannon Elleanor Guyett RN, MSN, [facility] Inpatient Diabetes Coordinator Team Pager [phone] (8a-5p)

## 2016-09-02 NOTE — [place] FL2 (Signed)
[place] MEDICAID FL2 LEVEL OF CARE SCREENING TOOL     IDENTIFICATION  Patient Name: [name] Birthdate: Feb 14, 1941 Sex: female Admission Date (Current Location): 08/28/2016  [facility] [place] [facility]  [employer]   Facility and Address:  [facility] [place], [address], [place], [place] [address]      Provider Number: [id number]  Attending Physician Name and Address:  Merlene Laughtermair Latif Sheikh, DO  Relative Name and Phone Number:       Current Level of Care: Hospital Recommended Level of Care: Skilled Nursing Facility Prior Approval Number:    Date Approved/Denied:   PASRR Number:    Discharge Plan: SNF    Current Diagnoses: Patient Active Problem List   Diagnosis Date Noted  . Acute confusion 08/28/2016  . Acute hypernatremia 08/28/2016  . Pressure injury of skin 08/28/2016  . Hypernatremia 08/28/2016  . Complicated UTI (urinary tract infection)   . DNR (do not resuscitate) 08/07/2016  . Urge incontinence of urine 08/07/2016  . Diabetes mellitus due to underlying condition with unspecified complications (HCC) 12/20/2015  . Dyslipidemia 12/20/2015  . Abnormal gait 12/05/2015  . Cervical disc disease 12/05/2015  . Chronic obstructive pulmonary disease (HCC) 12/05/2015  . Diabetic polyneuropathy associated with type 2 diabetes mellitus (HCC) 12/05/2015  . Essential hypertension 12/05/2015  . Generalized anxiety disorder 12/05/2015  . 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 Mixed hyperlipidemia 12/05/2015  . Moderate episode of recurrent major depressive disorder (HCC) 12/05/2015  . Non-rheumatic mitral regurgitation 12/05/2015  . Obstructive sleep apnea 12/05/2015  . Primary insomnia 12/05/2015  . Trigeminal neuralgia 12/05/2015    Orientation RESPIRATION BLADDER Height & Weight        Normal Incontinent Weight: 199 lb 1.2 oz (90.3 kg) Height:     BEHAVIORAL SYMPTOMS/MOOD NEUROLOGICAL BOWEL NUTRITION STATUS   Incontinent Diet (see DC summary)  AMBULATORY STATUS COMMUNICATION OF NEEDS Skin   Total Care Verbally PU Stage and Appropriate Care   PU Stage 2 Dressing:  (sacrum, buttocks)                   Personal Care Assistance Level of Assistance  Bathing, Dressing Bathing Assistance: Maximum assistance   Dressing Assistance: Maximum assistance     Functional Limitations Info             SPECIAL CARE FACTORS FREQUENCY                       Contractures      Additional Factors Info  Code Status, Allergies, Psychotropic, Isolation Precautions Code Status Info: DNR Allergies Info: Penicillins, Atorvastatin, Other     Isolation Precautions Info: MRSA     Current Medications (09/02/2016):  This is the current hospital active medication list Current Facility-Administered Medications  Medication Dose Route Frequency Provider Last Rate Last Dose  . 0.45 % sodium chloride infusion   Intravenous Continuous Zannie CovePreetha Joseph, MD 75 mL/hr at 09/02/16 1028    . 0.9 %  sodium chloride infusion  250 mL Intravenous PRN Ozella Rocksavid J Merrell, MD      . acetaminophen (TYLENOL) tablet 650 mg  650 mg Oral Q6H PRN Ozella Rocksavid J Merrell, MD       Or  . acetaminophen (TYLENOL) suppository 650 mg  650 mg Rectal Q6H PRN Ozella Rocksavid J Merrell, MD      . albuterol (PROVENTIL) (2.5 MG/3ML) 0.083% nebulizer solution 2.5 mg  2.5 mg Nebulization Q4H PRN Ozella Rocksavid J Merrell, MD      .  chlorhexidine (PERIDEX) 0.12 % solution 15 mL  15 mL Mouth Rinse BID Ozella Rocksavid J Merrell, MD   15 mL at 09/02/16 1030  . Chlorhexidine Gluconate Cloth 2 % PADS 6 each  6 each Topical Q0600 Merlene Laughtermair Latif Sheikh, DO   6 each at 09/02/16 1243  . heparin injection 5,000 Units  5,000 Units Subcutaneous Q8H Ozella Rocksavid J Merrell, MD   5,000 Units at 09/02/16 0500  . hydrALAZINE (APRESOLINE) injection 5-10 mg  5-10 mg Intravenous Q8H PRN Marcos EkeSara E Wertman, PA-C      . insulin aspart (novoLOG) injection 0-15 Units  0-15 Units Subcutaneous Q4H Zannie CovePreetha Joseph, MD   2  Units at 09/02/16 1039  . insulin glargine (LANTUS) injection 32 Units  32 Units Subcutaneous Daily Marcos EkeSara E Wertman, PA-C   32 Units at 09/02/16 1001  . linezolid (ZYVOX) IVPB 600 mg  600 mg Intravenous Q12H Zannie CovePreetha Joseph, MD   600 mg at 09/02/16 1028  . LORazepam (ATIVAN) injection 0.5 mg  0.5 mg Intravenous QHS PRN Marcos EkeSara E Wertman, PA-C   0.5 mg at 08/31/16 0530  . MEDLINE mouth rinse  15 mL Mouth Rinse q12n4p Ozella Rocksavid J Merrell, MD   15 mL at 09/02/16 1243  . mupirocin ointment (BACTROBAN) 2 %   Nasal BID Heloise Beechammair Latif Sheikh, DO      . ondansetron [facility](ZOFRAN) tablet 4 mg  4 mg Oral Q6H PRN Ozella Rocksavid J Merrell, MD       Or  . ondansetron [facility](ZOFRAN) injection 4 mg  4 mg Intravenous Q6H PRN Ozella Rocksavid J Merrell, MD      . pravastatin (PRAVACHOL) tablet 20 mg  20 mg Oral Daily Ozella Rocksavid J Merrell, MD      . sodium chloride flush (NS) 0.9 % injection 3 mL  3 mL Intravenous Q12H Ozella Rocksavid J Merrell, MD   3 mL at 09/02/16 1031  . sodium chloride flush (NS) 0.9 % injection 3 mL  3 mL Intravenous Q12H Ozella Rocksavid J Merrell, MD   3 mL at 09/02/16 1031  . sodium chloride flush (NS) 0.9 % injection 3 mL  3 mL Intravenous PRN Ozella Rocksavid J Merrell, MD         Discharge Medications: Please see discharge summary for a list of discharge medications.  Relevant Imaging Results:  Relevant Lab Results:   Additional Information    Burna SisUris, Aleece Loyd H, LCSW

## 2016-09-02 NOTE — Progress Notes (Signed)
PROGRESS NOTE    [name]  ZOX:[id number] DOB: 1940/12/31 DOA: 08/28/2016 PCP: Feliciana Rossetti, MD   Brief Narrative: [name] a 74 y.o.femalewith medical history significant for HTN,Diabetes, Arthritis, Dementia, Generalized Anxiety Disorder, COPD, Hyperlipidemia, Insomnia, History of Trigeminal Neuralgia, History of Major Depressive Disorders, Obstructive Sleep Apnea, History of recurrent UTIs and other comorbidts recently treated with 3 days of Cipro from 08-18-10/14. She is a bedbound patient brought from nursing facility to [facility] with a 4 day history of lethargy and was found to have a VRE UTI, AKI, Hypernatremia. Patient is still confused and altered but not to the point where she could not swallow. SLP did a swallow eval and recommended Thin liquid dietes with whole meds with Puree diet. Palliative Care Medicine Team was consulted for Goals of Care meeting given the patient's poor prognosis and have scheduled a Family Meeting tomorrow at 11:00 AM.  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   Mixed hyperlipidemia   Moderate episode of recurrent major depressive disorder (HCC)   Non-rheumatic mitral regurgitation   Obstructive sleep apnea   Primary insomnia   Trigeminal neuralgia   Urge incontinence of urine   Acute confusion   Acute hypernatremia   Pressure injury of skin   Hypernatremia   Complicated UTI (urinary tract infection)  Multifactorial Acute Metabolic Encephalopathy in a patient with baseline dementia - Likely due to Hypernatremia, AKI, sepsis, high dose  Gabapentin - more alert, but still does not answer questions, moans - slow improvement, cut down IVF to 1/2 NS -Stopped IV Meropenem yesterday and started IV Zyvox-urine cx from [place] with VRE -SLP did Swallow Eval and Patient to have Puree with Thin Liquids  Sepsis due to VRE UTI -Sepsis Physiology resolving - [facility]: urine cx with 100K VRE, they will fax report -C/w  IV Zyvox -repeat CXR negative for acute findings -Blood Cx negative -History of recurrent UTIs, recently treated with 3 days of Cipro from 08-18-10/14. -WBC went from 16.0 -> 13.5  Hypernatremia -Patient's Na+ was 152 this AM -Changed Fluids to D5 1/2 NS + 10 mEQ of KCl  Hypokalemia -Resolved. -Patient's K+ was 3.5 -Repeat BMP in AM  Type II Diabetes with Neuropathy  -Blood Sugar on BMP this AM was 186 -Changed Fluids to D5 1/2 NS as Patient had Hypoglycemic Episode overnight -Continue Lantus 32 sq daily, Moderate Novolog SSI  AKI, resolved -Patient's BUN/Cr went from 13/0.86 -> 11/0.95 -due to sepsis, meds-ACE/NSAIDs -Changed Fluids to D5 1/2 NS  Dementia -Continue to Monitor  COPD / OSA  -Stable, Continue Albuterol Nebs and O2 prn  -Continue CPAP QHS  Hypertension -Stable -Po Meds held because of Swallow Study C/w hydralazine PRN  GERD -Continue IV Protonix Daily  Depression and Anxiety  -Continue Effexor now that Patient can swallow  Deconditioning/ History of multiple falls, due to chronic abnormal gait, and peripheral neuropathy -PT/OT consulted -SNF with possible hospice  DVT prophylaxis: Heparin 5000 units sq Code Status: DNR Family Communication: No family at bedside Disposition Plan: SNF with possible Hospice when Stable  Consultants:   Palliative Care Medicine  Procedures: None  Antimicrobials: IV Zyvox   Subjective: Patient unable to provide a subjective Hx due to current condition. Moans but cannot answer questions.   Objective: Vitals:    09/01/16 1544 09/01/16 2000 09/02/16 0434 09/02/16 1000  BP: 140/63 135/75 (!) 142/86 (!) 127/56  Pulse:  68 93 73  Resp:      Temp:  97.7 F (36.5 C)  97.2 F (36.2 C)  TempSrc:  Oral  Axillary  SpO2:  95% (!) 89%   Weight:        Intake/Output Summary (Last 24 hours) at 09/02/16 1247 Last data filed at 09/02/16 0449  Gross per 24 hour  Intake          3309.17 ml  Output                0 ml  Net          3309.17 ml   Filed Weights   08/29/16 1638  Weight: 90.3 kg (199 lb 1.2 oz)    Examination: Physical Exam:  Constitutional: Obtunded and unable to provide Subjective Hx.  Eyes: Lids and conjunctivae normal, sclerae anicteric  ENMT: External Ears, Nose appear normal. Grossly normal hearing.  Neck: Appears normal, supple, no cervical masses, normal ROM, no appreciable thyromegaly, No JVD Respiratory: Diminished bilaterally, no wheezing, rales, rhonchi or crackles. Normal respiratory effort and patient is not tachypenic. No accessory muscle use.  Cardiovascular: RRR, no murmurs / rubs / gallops. S1 and S2 auscultated. No extremity edema.  Abdomen: Soft, non-tender, non-distended. No masses palpated. No appreciable hepatosplenomegaly. Bowel sounds positive.  GU: Deferred. Musculoskeletal: No clubbing / cyanosis of digits/nails. No joint deformity upper and lower extremities.  Skin: No rashes, lesions, ulcers. No induration; Warm and dry.  Neurologic: Moans on Sternal Rub but unable to participate in exam. Romberg sign cerebellar reflexes not assessed.  Psychiatric: Unable to Assess due to Current condition.   Data Reviewed: I have personally reviewed following labs and imaging studies  CBC:  Recent Labs Lab 08/28/16 0912 08/30/16 0112 08/31/16 0753 09/01/16 1001 09/02/16 0628  WBC 32.0* 24.8* 18.2* 16.0* 13.5*  NEUTROABS 28.5*  --   --   --   --   HGB 16.4* 15.2* 14.7 14.8 15.7*  HCT 53.2* 49.1* 47.6* 45.7 48.5*  MCV 95.7 91.8 91.0 87.4 88.7  PLT 257 182 135* 128*  122*   Basic Metabolic Panel:  Recent Labs Lab 08/28/16 0912  08/29/16 2036 08/30/16 0112 08/31/16 0753 09/01/16 1001 09/02/16 0628  NA 172*  < > 165* 166* 162* 145 152*  K 6.5*  < > 5.1 3.8 2.9* 2.5* 3.5  CL >130*  < > >130* >130* >130* 111 115*  CO2 20*  < > 18* 20* 22 24 29   GLUCOSE 205*  < > 249* 215* 187* 198* 186*  BUN 127*  < > 86* 79* 33* 13 11  CREATININE 4.19*  < > 2.49* 2.40* 1.29* 0.86 0.95  CALCIUM 8.3*  < > 8.2* 8.3* 7.8* 6.8* 7.1*  MG 3.0*  --   --   --   --   --   --   PHOS 6.2*  --   --   --   --   --   --   < > = values in this interval not displayed. GFR: CrCl cannot be calculated (Unknown ideal weight.). Liver Function Tests:  Recent Labs Lab 08/28/16 0912  AST 41  ALT 26  ALKPHOS 77  BILITOT 0.5  PROT 5.7*  ALBUMIN 2.2*   No results for input(s): LIPASE, AMYLASE in the last 168 hours.  Recent Labs Lab 08/28/16 0912  AMMONIA 21   Coagulation Profile:  Recent Labs Lab 08/28/16 0912  INR 1.34   Cardiac Enzymes:  Recent Labs Lab 08/28/16 0912 08/28/16 1415 08/28/16 1923  CKTOTAL 180  --   --   TROPONINI 0.03* 0.13* 0.03*   BNP (last 3 results) No results for input(s): PROBNP in the last 8760 hours. HbA1C: No results for input(s): HGBA1C in the last 72 hours. CBG:  Recent Labs Lab 09/02/16 0432 09/02/16 0502 09/02/16 0524 09/02/16 0737 09/02/16 1148  GLUCAP 50* 36* 96 145* 106*   Lipid Profile: No results for input(s): CHOL, HDL, LDLCALC, TRIG, CHOLHDL, LDLDIRECT in the last 72 hours. Thyroid Function Tests: No results for input(s): TSH, T4TOTAL, FREET4, T3FREE, THYROIDAB in the last 72 hours. Anemia Panel: No results for input(s): VITAMINB12, FOLATE, FERRITIN, TIBC, IRON, RETICCTPCT in the last 72 hours. Sepsis Labs:  Recent Labs Lab 08/28/16 1103 08/28/16 1636  LATICACIDVEN 2.5* 2.5*    Recent Results (from the past 240 hour(s))  MRSA PCR Screening     Status: Abnormal   Collection Time: 08/28/16  6:44 AM    Result Value Ref Range Status   MRSA by PCR POSITIVE (A) NEGATIVE Final    Comment:        The GeneXpert MRSA Assay (FDA approved for NASAL specimens only), is one component of a comprehensive MRSA colonization surveillance program. It is not intended to diagnose MRSA infection nor to guide or monitor treatment for MRSA infections. RESULT CALLED TO, READ BACK BY AND VERIFIED WITH: Edison PaceS. JACKSON [phone] 11.22.17 N. MORRIS   Culture, blood (Routine X 2) w Reflex to ID Panel     Status: None   Collection Time: 08/28/16 10:20 AM  Result Value Ref Range Status   Specimen Description BLOOD LEFT ARM  Final   Special Requests IN PEDIATRIC BOTTLE 1.5CC  Final   Culture NO GROWTH 5 DAYS  Final   Report Status 09/02/2016 FINAL  Final  Culture, blood (Routine X 2) w Reflex to ID Panel     Status: None   Collection Time: 08/28/16 10:28 AM  Result Value Ref Range Status   Specimen Description BLOOD RIGHT ARM  Final   Special Requests IN PEDIATRIC BOTTLE 2CC  Final   Culture NO GROWTH 5 DAYS  Final   Report Status 09/02/2016 FINAL  Final  Urine culture     Status: None   Collection Time: 08/28/16 10:56 AM  Result Value Ref Range Status   Specimen Description URINE, RANDOM  Final   Special Requests NONE  Final   Culture NO GROWTH  Final   Report Status 08/29/2016 FINAL  Final    Radiology Studies: Dg Hips Bilat With Pelvis 3-4 Views  Result Date: 08/31/2016 CLINICAL DATA:  Questionable superior pubic ramus fracture on the left on prior plain film, followup exam EXAM: DG HIP (WITH OR WITHOUT PELVIS) 3-4V BILAT COMPARISON:  08/28/2016 FINDINGS: Mild degenerative changes of the hip joints and lumbar spine are seen. No acute fracture or dislocation is noted. The abnormality in the superior pubic ramus on the left is felt to be artifactual in nature as no abnormality is noted. IMPRESSION: No evidence of acute fracture. Degenerative changes of the hip joints and lumbar spine are noted. Electronically  Signed   By: Alcide CleverMark  Lukens M.D.   On: 08/31/2016 16:33   Scheduled Meds: . chlorhexidine  15 mL Mouth Rinse BID  . Chlorhexidine Gluconate Cloth  6 each Topical Q0600  . heparin  5,000 Units Subcutaneous Q8H  . insulin aspart  0-15 Units Subcutaneous Q4H  . insulin glargine  32 Units Subcutaneous Daily  . linezolid (ZYVOX) IV  600 mg Intravenous Q12H  . mouth rinse  15 mL Mouth Rinse q12n4p  . mupirocin ointment   Nasal BID  . pravastatin  20 mg Oral Daily  . sodium chloride flush  3 mL Intravenous Q12H  . sodium chloride flush  3 mL Intravenous Q12H   Continuous Infusions: . sodium chloride 75 mL/hr at 09/02/16 1028    LOS: 5 days   Merlene Laughtermair Latif Sheikh, DO [facility] Pager [phone]  If 7PM-7AM, please contact night-coverage [email or web address] Password [facility] 09/02/2016, 12:47 PM

## 2016-09-02 NOTE — Care Management Note (Signed)
Case Management Note  Patient Details  Name: [name] MRN: [id number] Date of Birth: Mar 09, 1941  Subjective/Objective:            Patient admitted with confusion 2/2 hypernatremia (Na >160 on admit) and VRE UTI. Pallaitive consult with family tomorrow. [place] anticipate DC to SNF. CM notifed CSW that patient is currently on Zyvox to inform SNF while coordinating placement.    Patient mentation slowly improving, IVF, today able to pass SLP exam to take PO safely.    Action/Plan:  Anticipate DC to SNF.   Expected Discharge Date:                  Expected Discharge Plan:  Skilled Nursing Facility  In-House Referral:  Clinical Social Work, Hospice / Palliative Care  Discharge planning Services  CM Consult  Post Acute Care Choice:    Choice offered to:     DME Arranged:    DME Agency:     HH Arranged:    HH Agency:     Status of Service:  In process, will continue to follow  If discussed at Long Length of Stay Meetings, dates discussed:    Additional Comments:  Lawerance SabalDebbie Yui Mulvaney, RN 09/02/2016, 3:07 PM

## 2016-09-02 NOTE — Progress Notes (Signed)
Speech Language Pathology Treatment: Dysphagia  Patient Details Name: [name] MRN: [id number] DOB: [birth year] Today's Date: 09/02/2016 Time: 0912-0936 SLP Time Calculation (min) (ACUTE ONLY): 24 min  Assessment / Plan / Recommendation Clinical Impression  Pt demonstrates further improvement in cognition today. SLP repositioned pt, completed oral care (pt said "yuck!") and provided max verbal dn tactile cues for acceptance of thin liquids. Initially pt with anterior spillage, oral holding. Automaticity improved with trials of milk (requested by pt) and then coke, which she actually seemed to enjoy. One episode of cough, likely due to delayed swallow initiaiton, but cough strong and likely effective in expelling penetrate/aspirate. Given pts need to start PO and refusal of most POs offered, recommend initiating thin liquids and puree with total assist and monitor for tolerance. May be able to upgrade solids with further improvement. Would try pills whole as she is likely to spit out or hold bitter boluses.    HPI HPI: 75 y.o.femalewith medical history significant for HTN,diabetes, arthritis, dementia, generalized anxiety disorder, COPD, hyperlipidemia, insomnia, history of trigeminal neuralgia, history of major depressive disorders, obstructive sleep apnea,History of recurrent UTIs Recently treated with 3 days of Cipro from 08-18-10/14 ,bed[facility] with [facility] 4 day history of confusion.  Dx acute metabolic encephalopathy due to hypernatremia, AKI, sepsis      SLP Plan  Continue with current plan of care     Recommendations  Diet recommendations: Dysphagia 1 (puree);Thin liquid Liquids provided via: Straw Medication Administration: Whole meds with puree Supervision: Full supervision/cueing for compensatory strategies Compensations: Slow rate;Small sips/bites;Minimize environmental distractions                Plan: Continue with  current plan of care       GO               Harlon DittyBonnie Domenica Weightman, MA CCC-SLP [phone]  Claudine MoutonDeBlois, Anyla Israelson Caroline 09/02/2016, 9:51 AM

## 2016-09-03 DIAGNOSIS — G9340 Encephalopathy, unspecified: Secondary | ICD-10-CM

## 2016-09-03 DIAGNOSIS — Z66 Do not resuscitate: Secondary | ICD-10-CM

## 2016-09-03 DIAGNOSIS — Z515 Encounter for palliative care: Secondary | ICD-10-CM

## 2016-09-03 LAB — MAGNESIUM: MAGNESIUM: 1.3 mg/dL — AB (ref 1.7–2.4)

## 2016-09-03 LAB — CBC WITH DIFFERENTIAL/PLATELET
BASOS PCT: 0 %
Basophils Absolute: 0 10*3/uL (ref 0.0–0.1)
Eosinophils Absolute: 0.1 10*3/uL (ref 0.0–0.7)
Eosinophils Relative: 1 %
HEMATOCRIT: 45.4 % (ref 36.0–46.0)
HEMOGLOBIN: 14.5 g/dL (ref 12.0–15.0)
LYMPHS PCT: 16 %
Lymphs Abs: 2 10*3/uL (ref 0.7–4.0)
MCH: 28.1 pg (ref 26.0–34.0)
MCHC: 31.9 g/dL (ref 30.0–36.0)
MCV: 88 fL (ref 78.0–100.0)
MONO ABS: 0.8 10*3/uL (ref 0.1–1.0)
MONOS PCT: 7 %
NEUTROS ABS: 9.4 10*3/uL — AB (ref 1.7–7.7)
NEUTROS PCT: 76 %
Platelets: 139 10*3/uL — ABNORMAL LOW (ref 150–400)
RBC: 5.16 MIL/uL — ABNORMAL HIGH (ref 3.87–5.11)
RDW: 14.7 % (ref 11.5–15.5)
WBC: 12 10*3/uL — ABNORMAL HIGH (ref 4.0–10.5)

## 2016-09-03 LAB — GLUCOSE, CAPILLARY
Glucose-Capillary: 118 mg/dL — ABNORMAL HIGH (ref 65–99)
Glucose-Capillary: 130 mg/dL — ABNORMAL HIGH (ref 65–99)
Glucose-Capillary: 133 mg/dL — ABNORMAL HIGH (ref 65–99)
Glucose-Capillary: 197 mg/dL — ABNORMAL HIGH (ref 65–99)

## 2016-09-03 LAB — PHOSPHORUS: Phosphorus: 1.9 mg/dL — ABNORMAL LOW (ref 2.5–4.6)

## 2016-09-03 LAB — COMPREHENSIVE METABOLIC PANEL
ALBUMIN: 2 g/dL — AB (ref 3.5–5.0)
ALK PHOS: 78 U/L (ref 38–126)
ALT: 23 U/L (ref 14–54)
ANION GAP: 9 (ref 5–15)
AST: 41 U/L (ref 15–41)
BILIRUBIN TOTAL: 0.9 mg/dL (ref 0.3–1.2)
BUN: 13 mg/dL (ref 6–20)
CALCIUM: 6.9 mg/dL — AB (ref 8.9–10.3)
CO2: 26 mmol/L (ref 22–32)
Chloride: 116 mmol/L — ABNORMAL HIGH (ref 101–111)
Creatinine, Ser: 0.86 mg/dL (ref 0.44–1.00)
GFR calc Af Amer: >60 mL/min (ref >60)
GLUCOSE: 151 mg/dL — AB (ref 65–99)
Potassium: 2.4 mmol/L — CL (ref 3.5–5.1)
Sodium: 151 mmol/L — ABNORMAL HIGH (ref 135–145)
TOTAL PROTEIN: 5.2 g/dL — AB (ref 6.5–8.1)

## 2016-09-03 MED ORDER — LORAZEPAM 1 MG PO TABS: 1.0000 mg | ORAL_TABLET | ORAL | 0 refills | Status: AC | PRN

## 2016-09-03 MED ORDER — MORPHINE SULFATE (CONCENTRATE) 10 MG/0.5ML PO SOLN: 5.0000 mg | ORAL | Status: DC | PRN

## 2016-09-03 MED ORDER — LORAZEPAM 1 MG PO TABS: 1.0000 mg | ORAL_TABLET | ORAL | Status: DC | PRN

## 2016-09-03 MED ORDER — SODIUM CHLORIDE 0.9 % IV SOLN
30.0000 meq | Freq: Once | INTRAVENOUS | Status: DC
  Administered 2016-09-03: 30 meq via INTRAVENOUS
  Filled 2016-09-03 (×2): qty 15

## 2016-09-03 MED ORDER — SODIUM CHLORIDE 0.9 % IV SOLN
30.0000 meq | Freq: Once | INTRAVENOUS | Status: AC
  Administered 2016-09-03: 30 meq via INTRAVENOUS
  Filled 2016-09-03: qty 15

## 2016-09-03 MED ORDER — MORPHINE SULFATE (CONCENTRATE) 10 MG/0.5ML PO SOLN: 5.0000 mg | ORAL | 0 refills | Status: AC | PRN

## 2016-09-03 MED ORDER — INSULIN GLARGINE 100 UNIT/ML ~~LOC~~ SOLN: 30.0000 [IU] | Freq: Every day | SUBCUTANEOUS | 11 refills | Status: AC

## 2016-09-03 MED ORDER — MAGNESIUM SULFATE 2 GM/50ML IV SOLN
2.0000 g | Freq: Once | INTRAVENOUS | Status: AC
  Administered 2016-09-03: 2 g via INTRAVENOUS
  Filled 2016-09-03: qty 50

## 2016-09-03 NOTE — Progress Notes (Signed)
Palliative team recommending palliative care at SNF- CSW confirmed this plan with patient niece, [name], who is agreeable [facility]  [facility] Anticipated discharge date: 11/28 Family notified: [name], niece Transportation by Sharin MonsPTAR- called at 3:10pm- anticipate 1-1.5hrs before pick up  CSW signing off.  Burna SisJenna H. Lasasha Brophy, LCSW Clinical Social Worker [phone]

## 2016-09-03 NOTE — Progress Notes (Signed)
Speech Language Pathology Treatment: Dysphagia  Patient Details Name: [name] MRN: [id number] DOB: [birth year] Today's Date: 09/03/2016 Time: [id number] SLP Time Calculation (min) (ACUTE ONLY): 12 min  Assessment / Plan / Recommendation Clinical Impression  Pt seen with am meal. NT struggling to feed patient due to oral holding, pt refusal of most foods. SLP repositioned pt and attempted to continue meal with minimal success. Pt favors sweet foods and drinks and accepted two bites of pudding and two sips of coke, and then turned head in refusal. She will occasionally nod y/n, but would not respond to therapist questions about painful swallowing, given her facial expression indicating pain. Pt tolerated textures, but her dementia impacts her ability to nutritionally support herself. Recommend continue current diet with nutritional supplements. Will f/u x1 to check if pts behavior improves with more time or if she can communicate more about wants needs.    HPI HPI: 75 y.o.femalewith medical history significant for HTN,diabetes, arthritis, dementia, generalized anxiety disorder, COPD, hyperlipidemia, insomnia, history of trigeminal neuralgia, history of major depressive disorders, obstructive sleep apnea,History of recurrent UTIs Recently treated with 3 days of Cipro from 08-18-10/14 ,bed[facility] 4 day history of confusion.  Dx acute metabolic encephalopathy due to hypernatremia, AKI, sepsis      SLP Plan  Continue with current plan of care     Recommendations  Diet recommendations: Dysphagia 1 (puree);Thin liquid Liquids provided via: Straw Medication Administration: Whole meds with puree Supervision: Full supervision/cueing for compensatory strategies Compensations: Slow rate;Small sips/bites;Minimize environmental distractions                Plan: Continue with current plan of care       GO                Harlon DittyBonnie Amaris Garrette, MA CCC-SLP [phone]  Claudine MoutonDeBlois, Mantaj Chamberlin Caroline 09/03/2016, 11:46 AM

## 2016-09-03 NOTE — Consult Note (Signed)
Consultation Note Date: 09/03/2016   Patient Name: [name]  DOB: [birth year]  MRN: [id number]  Age / Sex: 75 y.o., female  PCP: Gordan PaymentGreg A. Grisso, MD Referring Physician: Tyrone Nineyan B Grunz, MD  Reason for Consultation: Establishing goals of care, Non pain symptom management, Pain control and Psychosocial/spiritual support  HPI/Patient Profile: 75 y.o. female  admitted on 08/28/2016 with a past medical history significant for HTN, diabetes, arthritis, dementia, generalized anxiety disorder, COPD, hyperlipidemia, insomnia, history of trigeminal neuralgia, history of major depressive disorders, obstructive sleep apnea,History of recurrent UTIs    Recently treated with 3 days of Cipro from 08-18-10/14 , bedbound patient [facility] nursing [facility] with a 4 day history of confusion.  Continued physical, functional and cognitive decline over the past several years.  Today family makes decisions for shift to full comfort   Clinical Assessment and Goals of Care:  This NP Lorinda CreedMary Janae Bonser reviewed medical records, received report from team, assessed the patient and then meet at the patient's bedside along with her two neices  to discuss diagnosis, prognosis, GOC, EOL wishes disposition and options.  A detailed discussion was had today regarding advanced directives.  Concepts specific to code status, artifical feeding and hydration, continued IV antibiotics and rehospitalization was had.  The difference between a aggressive medical intervention path  and a palliative comfort care path for this patient at this time was had.  Values and goals of care important to patient and family were attempted to be elicited.  MOST form completed   Concept of Hospice and Palliative Care were discussed  Natural trajectory and expectations at EOL were discussed.  Questions and concerns addressed.   Family encouraged  to call with questions or concerns.  PMT will continue to support holistically.  Patient has had continued physical, functional and cognitive decline over the past several years, family speaks to patients poor choices and little insight into her current medical situation.  Per family patient has been financially scammed in the past.  Family face advanced directive decisions and anticipatory care needs.     HCPOA- none documented but niece/Carmen Liberatore has been acting in patient's best interest and is the main support person.  Other family member/ Manual MeierRebecca Lowe present today supports Carmen's decisions.    Patient has never been married and has no children and so living siblings     SUMMARY OF RECOMMENDATIONS    Code Status/Advance Care Planning:  DNR   Symptom Management:   Pain/Dyspnea: Roxanol 5 mg po/sl every 1 hr prn  Anxiety: Ativan 1 mg po/sl every 4 hrs prn  Palliative Prophylaxis:   Aspiration, Bowel Regimen, Frequent Pain Assessment and Oral Care  Additional Recommendations (Limitations, Scope, Preferences):  Full Comfort Care  Psycho-social/Spiritual:    Additional Recommendations: Education on Hospice  Prognosis:   < 3 months  Discharge Planning:  SW will facilitate disposition back to SNF, recommend hospice or palliative services when eligible   Skilled Nursing Facility with Hospice      Primary Diagnoses: Present  on Admission: . Cervical disc disease . Chronic obstructive pulmonary disease (HCC) . Diabetes mellitus due to underlying condition with unspecified complications (HCC) . Diabetic polyneuropathy associated with type 2 diabetes mellitus (HCC) . DNR (do not resuscitate) . Dyslipidemia . Essential hypertension . Generalized anxiety disorder . Mixed hyperlipidemia . 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 Moderate episode of recurrent major depressive disorder (HCC) . Non-rheumatic mitral regurgitation . Obstructive sleep  apnea . Primary insomnia . Trigeminal neuralgia . Urge incontinence of urine . Acute confusion . Hypernatremia   I have reviewed the medical record, interviewed the patient and family, and examined the patient. The following aspects are pertinent.  Past Medical History:  Diagnosis Date  . Anxiety   . Arthritis   . Bladder problem   . Depression   . Diabetes mellitus without complication   . Hypertension   . Swelling    Social History   Social History  . Marital status: Widowed    Spouse name: N/A  . Number of children: N/A  . Years of education: N/A   Social History Main Topics  . Smoking status: Never Smoker  . Smokeless tobacco: Never Used  . Alcohol use No  . Drug use: No  . Sexual activity: Not on file   Other Topics Concern  . Not on file   Social History Narrative  . No narrative on file   No family history on file. Scheduled Meds: . chlorhexidine  15 mL Mouth Rinse BID  . Chlorhexidine Gluconate Cloth  6 each Topical Q0600  . magnesium sulfate 1 - 4 g bolus IVPB  2 g Intravenous Once  . mouth rinse  15 mL Mouth Rinse q12n4p  . mupirocin ointment   Nasal BID  . sodium chloride flush  3 mL Intravenous Q12H  . sodium chloride flush  3 mL Intravenous Q12H   Continuous Infusions: . dextrose 5 % and 0.45 % NaCl with KCl 10 mEq/L 75 mL/hr at 09/03/16 1251   PRN Meds:.sodium chloride, acetaminophen **OR** acetaminophen, albuterol, hydrALAZINE, LORazepam, morphine CONCENTRATE, ondansetron **OR** ondansetron (ZOFRAN) IV, sodium chloride flush Medications Prior to Admission:  Prior to Admission medications   Medication Sig Start Date End Date Taking? Authorizing Provider  acetaminophen (TYLENOL) 325 MG tablet Take 650 mg by mouth every 6 (six) hours as needed (pain). Do not exceed more than 3 gm in 24 hours from all sources   Yes Historical Provider, MD  amLODipine (NORVASC) 5 MG tablet Take 5 mg by mouth daily.   Yes Historical Provider, MD  aspirin EC 81 MG  tablet Take 81 mg by mouth daily.   Yes Historical Provider, MD  buPROPion (WELLBUTRIN) 100 MG tablet Take 100 mg by mouth 2 (two) times daily.    Yes Historical Provider, MD  ertapenem (INVANZ) 1 g injection Inject 1 g into the muscle See admin instructions. Inject 1 gram intramuscularly daily for 10 days (may mix with lidocaine) - start date 08/20/16   Yes Historical Provider, MD  furosemide (LASIX) 40 MG tablet Take 40 mg by mouth daily.   Yes Historical Provider, MD  gabapentin (NEURONTIN) 600 MG tablet Take 600 mg by mouth 2 (two) times daily.    Yes Historical Provider, MD  insulin glargine (LANTUS) 100 UNIT/ML injection Inject 70 Units into the skin at bedtime.    Yes Historical Provider, MD  insulin regular (NOVOLIN R,HUMULIN R) 250 units/2.29mL (100 units/mL) injection Inject 0-10 Units into the skin 4 (four) times daily -  before meals and at bedtime. Per sliding scale: CBG 0-149 0 units, 150-200 2 units, 201-250 4 units, 251-300 6 units, 301-350 8 units, 351-400 10 units, 401-402 notify MD   Yes Historical Provider, MD  LORazepam (ATIVAN) 0.5 MG tablet Take 0.25-0.5 mg by mouth every 12 (twelve) hours as needed for anxiety.    Yes Historical Provider, MD  losartan (COZAAR) 100 MG tablet Take 100 mg by mouth daily.   Yes Historical Provider, MD  meclizine (ANTIVERT) 25 MG tablet Take 25 mg by mouth every 8 (eight) hours as needed for dizziness.   Yes Historical Provider, MD  omeprazole (PRILOSEC) 20 MG capsule Take 20 mg by mouth daily before breakfast.   Yes Historical Provider, MD  ondansetron (ZOFRAN) 4 MG tablet Take 4 mg by mouth every 6 (six) hours as needed for nausea or vomiting.   Yes Historical Provider, MD  oxybutynin (DITROPAN XL) 15 MG 24 hr tablet Take 15 mg by mouth daily.   Yes Historical Provider, MD  oxyCODONE (OXY IR/ROXICODONE) 5 MG immediate release tablet Take 5 mg by mouth every 6 (six) hours as needed (pain).   Yes Historical Provider, MD  pantoprazole (PROTONIX) 20 MG  tablet Take 20 mg by mouth daily before breakfast.   Yes Historical Provider, MD  potassium chloride SA (K-DUR,KLOR-CON) 20 MEQ tablet Take 40 mEq by mouth 2 (two) times daily.   Yes Historical Provider, MD  pravastatin (PRAVACHOL) 20 MG tablet Take 20 mg by mouth at bedtime.    Yes Historical Provider, MD  traZODone (DESYREL) 50 MG tablet Take 25 mg by mouth at bedtime as needed (insomnia).   Yes Historical Provider, MD  vortioxetine HBr (TRINTELLIX) 10 MG TABS Take 20 mg by mouth at bedtime.   Yes Historical Provider, MD  Tavaborole (KERYDIN) 5 % SOLN Apply one drop each affected nail once daily for 12 months Patient not taking: Reported on 08/28/2016 11/28/14   Alvan Dame, DPM  traMADol (ULTRAM) 50 MG tablet Take 1 tablet (50 mg total) by mouth 2 (two) times daily. Patient not taking: Reported on 08/28/2016 11/14/14   Alvan Dame, DPM   Allergies  Allergen Reactions  . Penicillins Rash    No other information available at this time  . Atorvastatin Other (See Comments)    Myalgias - reported by [facility] in 2015  . Other Swelling    Throat swelling from melon, cantaloupe and honey dew reported by [facility] and [facility] in 2015   Review of Systems  Unable to perform ROS: Acuity of condition    Physical Exam  Constitutional: She appears well-developed. She appears lethargic. She appears ill.  Cardiovascular: Normal rate, regular rhythm and normal heart sounds.   Pulmonary/Chest: She has decreased breath sounds in the right lower field and the left lower field.  Neurological: She appears lethargic.  Skin: Skin is warm and dry.    Vital Signs: BP 136/61 (BP Location: Right Wrist)   Pulse (!) 58 Comment: pulse ox fel of finger when validating  Temp 98 F (36.7 C) (Oral)   Resp 19   Ht 5\' 5"  (1.651 m)   Wt 90.3 kg (199 lb) Comment: 08/29/16  SpO2 94%   BMI 33.12 kg/m  Pain Assessment: PAINAD POSS *See Group Information*: 1-Acceptable,Awake and  alert     SpO2: SpO2: 94 % O2 Device:SpO2: 94 % O2 Flow Rate: .O2 Flow Rate (L/min): 2 L/min  IO: Intake/output summary:  Intake/Output Summary (Last  24 hours) at 09/03/16 1323 Last data filed at 09/03/16 1042  Gross per 24 hour  Intake              965 ml  Output                0 ml  Net              965 ml    LBM: Last BM Date: 09/02/16 Baseline Weight: Weight: 90.3 kg (199 lb 1.2 oz) Most recent weight: Weight: 90.3 kg (199 lb) (08/29/16)     Palliative Assessment/Data:  30 % at best   Flowsheet Rows   Flowsheet Row Most Recent Value  Intake Tab  Referral Department  Hospitalist  Unit at Time of Referral  Med/Surg Unit  Palliative Care Primary Diagnosis  Neurology  Date Notified  09/01/16  Palliative Care Type  New Palliative care  Reason for referral  Clarify Goals of Care  Date of Admission  08/28/16  Date first seen by Palliative Care  09/02/16  # of days Palliative referral response time  1 Day(s)  # of days IP prior to Palliative referral  4  Clinical Assessment  Psychosocial & Spiritual Assessment  Palliative Care Outcomes     Discussed with Dr Jarvis NewcomerGrunz and CMRN  Time In: 1200 Time Out: 1330 Time Total: 90 min Greater than 50%  of this time was spent counseling and coordinating care related to the above assessment and plan.  Signed by: Lorinda CreedLARACH, Rozalyn Osland, NP   Please contact Palliative Medicine Team phone at [phone] for questions and concerns.  For individual provider: See Loretha StaplerAmion

## 2016-09-03 NOTE — Discharge Summary (Addendum)
Physician Discharge Summary  [name] JXB:[id number] DOB: [birth year] DOA: 08/28/2016  PCP: Feliciana RossettiGRISSO,GREG, MD  Admit date: 08/28/2016 Discharge date: 09/03/2016  Admitted From: SNF Disposition: SNF with Palliative services   Recommendations for Outpatient Follow-up:  1. Comfort care 2. Decreased lantus to 30u qHS due to poor po and hypoglycemia.  Home Health: None Equipment/Devices: None new  Discharge Condition: Stable for transfer, guarded prognosis.  CODE STATUS: DNR Diet recommendation: Dysphagia 1 (puree), thin liquids via straw. Ad lib  Brief/Interim Summary: Anita Levynne Schusteris a 75 y.o.femalewith medical history significant for HTN,Diabetes, Arthritis, Dementia, Generalized Anxiety Disorder, COPD, Hyperlipidemia, Insomnia, History of Trigeminal Neuralgia, History of Major Depressive Disorders, Obstructive Sleep Apnea, History of recurrent UTIs and other comorbidts recently treated with 3 days of Cipro from 08-18-10/14. She is a bedbound patient [facility] nursing [facility] with a 4 day history of lethargy and was found to have a VRE UTI, AKI, Hypernatremia. Patient is still confused and altered but not to the point where she could not swallow. SLP did a swallow eval and recommended Thin liquid dietes with whole meds with Puree diet. Palliative Care Medicine Team was consulted for Goals of Care meeting given the patient's poor prognosis. A Family Meeting 11/28 was scheduled and the decision for full comfort care was reached unanimously. The medications that are not intended to improve comfort were discontinued and she was discharged to SNF with palliative care services.   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 Still does not answer questions, moans  Sepsis due to VRE UTI: Sepsis Physiology resolving. [facility]: urine cx with 100K VRE, repeat CXR negative for acute findings. Blood Cx negative. History of recurrent UTIs, recently treated with 3 days of Cipro from 08-18-10/14. WBC improved.  - Stop abx as result of family decision for comfort care  Hypernatremia: Due to poor po hydration.  -Patient's Na+ was 152 this AM -Changed Fluids to D5 1/2 NS + 10 mEQ of KCl  Hypokalemia: Resolved s/p repletion, on recheck 11/28 was 2.4 and repleted via IV. Patient made comfort care prior to recheck, so this will not be done to avoid venipuncture.  Type II Diabetes with Neuropathy Patient had Hypoglycemic Episode 11/27. Very poor po. - Continue Lantus 30 sq daily and no mealtime insulin, this will avoid dangerous hyperglycemia while minimizing CBG checks/mealtime insulin.   AKI, resolved  Dementia -Continue to Monitor  COPD / OSA  -Stable, Continue Albuterol Nebs and O2 prn  -Continue CPAP QHS if tolerated.   Hypertension -Stable while holding po meds. Will discontinue antihypertensive medications given comfort care.  GERD -Continue PPI. Consider discontinuing if not improving any symptoms.    Depression and Anxiety  -Continue medications for this.  Deconditioning/ History of multiple falls, due to chronic abnormal gait, and peripheral neuropathy -PT/OT consulted: Dispo to SNF with palliative services.   Discharge Instructions Discharge Instructions    Discharge instructions    Complete by:  As directed    SEE DISCHARGE SUMMARY       Medication List    STOP taking these medications   amLODipine 5 MG tablet Commonly known as:  NORVASC   aspirin EC 81 MG tablet   furosemide 40 MG tablet Commonly known as:  LASIX   insulin regular 250 units/2.74mL (100 units/mL) injection Commonly known as:  NOVOLIN R,HUMULIN R   INVANZ 1 g injection Generic drug:  ertapenem   losartan 100 MG tablet Commonly known as:  COZAAR   omeprazole 20 MG capsule Commonly known as:  PRILOSEC   oxyCODONE 5 MG immediate release tablet Commonly known as:  Oxy IR/ROXICODONE   potassium chloride SA 20 MEQ tablet Commonly known as:  K-DUR,KLOR-CON   pravastatin 20 MG tablet Commonly known as:  PRAVACHOL   Tavaborole 5 % Soln Commonly known as:  KERYDIN   traMADol 50 MG tablet Commonly known as:  ULTRAM     TAKE these medications   acetaminophen 325 MG tablet Commonly known as:  TYLENOL Take 650 mg by mouth every 6 (six) hours as needed (pain). Do not exceed more than 3 gm in 24 hours from all sources   buPROPion 100 MG tablet Commonly known as:  WELLBUTRIN Take 100 mg by mouth 2 (two) times daily.   gabapentin 600 MG tablet Commonly known as:  NEURONTIN Take 600 mg by mouth 2 (two) times daily.   insulin glargine 100 UNIT/ML injection Commonly known as:  LANTUS Inject 0.3 mLs (30 Units total) into the skin at bedtime. What changed:  how much to take   LORazepam 1 MG tablet Commonly known as:  ATIVAN Take 1 tablet (1 mg total) by mouth every 4 (four) hours as needed for anxiety. What changed:  medication strength  how much to take  when to take this    meclizine 25 MG tablet Commonly known as:  ANTIVERT Take 25 mg by mouth every 8 (eight) hours as needed for dizziness.   morphine CONCENTRATE 10 MG/0.5ML Soln concentrated solution Take 0.25 mLs (5 mg total) by mouth every hour as needed for moderate pain (dyspnea).   ondansetron 4 MG tablet Commonly known as:  ZOFRAN Take 4 mg by mouth every 6 (six) hours as needed for nausea or vomiting.   oxybutynin 15 MG 24 hr tablet Commonly known as:  DITROPAN XL Take 15 mg by mouth daily.   pantoprazole 20 MG tablet Commonly known as:  PROTONIX Take 20 mg by mouth daily before breakfast.   traZODone 50 MG tablet Commonly known as:  DESYREL Take 25 mg by mouth at bedtime as needed (insomnia).   TRINTELLIX 10 MG Tabs Generic drug:  vortioxetine HBr Take 20 mg by mouth at bedtime.       Allergies  Allergen Reactions  . Penicillins Rash    No other information available at this time  . Atorvastatin Other (See Comments)    Myalgias - reported by [facility] in 2015  . Other Swelling    Throat swelling from melon, cantaloupe and honey dew reported by [facility] and [facility] in 2015  Consultations:  Palliative care medicine  Procedures/Studies: Dg Chest 2 View  Result Date: 08/30/2016 CLINICAL DATA:  Sepsis EXAM: CHEST  2 VIEW COMPARISON:  None. FINDINGS: Cardiomediastinal silhouette is stable. No acute infiltrate or pleural effusion. No pulmonary edema. Degenerative changes bilateral shoulders. IMPRESSION: No active cardiopulmonary disease. Electronically Signed   By: Natasha Mead M.D.   On: 08/30/2016 10:17   Ct Head Wo Contrast  Result Date: 08/30/2016 CLINICAL DATA:  Four days of confusion EXAM: CT HEAD WITHOUT CONTRAST TECHNIQUE: Contiguous axial images were obtained from the base of the skull through the vertex without intravenous contrast. COMPARISON:  None. FINDINGS: Brain: No evidence of acute infarction, hemorrhage, hydrocephalus,  extra-axial collection or mass lesion/mass effect. Diffuse atrophic change and chronic white matter ischemic change are seen. Vascular: No hyperdense vessel or unexpected calcification. Skull: Normal. Negative for fracture or focal lesion. Sinuses/Orbits: No acute finding. Other: None. IMPRESSION: Chronic atrophic and ischemic changes without acute abnormality. Electronically Signed   By: Alcide Clever M.D.   On: 08/30/2016 13:08   Portable Chest 1 View  Result Date: 08/28/2016 CLINICAL DATA:  Unresponsive with labored breathing EXAM: PORTABLE CHEST 1 VIEW COMPARISON:  None. FINDINGS: Lungs are clear. Heart size and pulmonary vascularity are normal. No adenopathy. There is atherosclerotic calcification in the aorta. No bone lesions. IMPRESSION: Aortic atherosclerosis.  No edema or consolidation. Electronically Signed   By: Bretta Bang III M.D.   On: 08/28/2016 09:09   Dg Abd Portable 1v  Result Date: 08/28/2016 CLINICAL DATA:  75 year old female with a history of confusion EXAM: PORTABLE ABDOMEN - 1 VIEW COMPARISON:  None. FINDINGS: Minimal gas within small bowel and colon.  No abnormal distention. No radiopaque foreign body. Urinary catheter in position. Surgical changes of cholecystectomy. No unexpected calcification or soft tissue density. Degenerative changes of the spine and bilateral hips. Frontal view demonstrates disruption of the left-sided pectineal line, near the pubic symphysis. No comparison available. IMPRESSION: Nonobstructive bowel gas pattern. Urinary catheter in position. This view demonstrates disruption of the left pectineal line, potentially representing a pelvic fracture. Recommend correlation with pelvic plain film series for further evaluation as there are no comparisons available. Signed, Yvone Neu. Loreta Ave, DO [facility] Electronically Signed   By: Gilmer Mor D.O.   On: 08/28/2016 09:13   Dg Hips Bilat With Pelvis  3-4 Views  Result Date: 08/31/2016 CLINICAL DATA:  Questionable superior pubic ramus fracture on the left on prior plain film, followup exam EXAM: DG HIP (WITH OR WITHOUT PELVIS) 3-4V BILAT COMPARISON:  08/28/2016 FINDINGS: Mild degenerative changes of the hip joints and lumbar spine are seen. No acute fracture or dislocation is noted. The abnormality in the superior pubic ramus on the left is felt to be artifactual in nature as no abnormality is noted. IMPRESSION: No evidence of acute fracture. Degenerative changes of the hip joints and lumbar spine are noted. Electronically Signed   By: Alcide Clever M.D.   On: 08/31/2016 16:33    Subjective: Patient responsive without forming words, moans. Unable to cooperate with exam.   Discharge Exam: Vitals:   09/03/16 0514 09/03/16 1349  BP: 136/61 130/76  Pulse: (!) 58 95  Resp: 19 16  Temp: 98 F (36.7 C) 99.3 F (37.4 C)   Vitals:   09/02/16 2216 09/03/16 0514 09/03/16 1104 09/03/16 1349  BP: 129/73 136/61  130/76  Pulse: (!) 37 (!) 58  95  Resp: 16 19  16   Temp: 97.3 F (  36.3 C) 98 F (36.7 C)  99.3 F (37.4 C)  TempSrc: Oral Oral  Oral  SpO2: 97% 94%  91%  Weight:   90.3 kg (199 lb)   Height:   5\' 5"  (1.651 m)    General: Pt is alert, disoriented, not in acute distress Cardiovascular: RRR, no JVD Respiratory: Nonlabored. Clear anteriorly Abdominal: Soft, NT, ND, bowel sounds + Extremities: no edema, no cyanosis  The results of significant diagnostics from this hospitalization (including imaging, microbiology, ancillary and laboratory) are listed below for reference.    Microbiology: Recent Results (from the past 240 hour(s))  MRSA PCR Screening     Status: Abnormal   Collection Time: 08/28/16  6:44 AM  Result Value Ref Range Status   MRSA by PCR POSITIVE (A) NEGATIVE Final    Comment:        The GeneXpert MRSA Assay (FDA approved for NASAL specimens only), is one component of a comprehensive MRSA  colonization surveillance program. It is not intended to diagnose MRSA infection nor to guide or monitor treatment for MRSA infections. RESULT CALLED TO, READ BACK BY AND VERIFIED WITH: Edison PaceS. JACKSON [phone] 11.22.17 N. MORRIS   Culture, blood (Routine X 2) w Reflex to ID Panel     Status: None   Collection Time: 08/28/16 10:20 AM  Result Value Ref Range Status   Specimen Description BLOOD LEFT ARM  Final   Special Requests IN PEDIATRIC BOTTLE 1.5CC  Final   Culture NO GROWTH 5 DAYS  Final   Report Status 09/02/2016 FINAL  Final  Culture, blood (Routine X 2) w Reflex to ID Panel     Status: None   Collection Time: 08/28/16 10:28 AM  Result Value Ref Range Status   Specimen Description BLOOD RIGHT ARM  Final   Special Requests IN PEDIATRIC BOTTLE 2CC  Final   Culture NO GROWTH 5 DAYS  Final   Report Status 09/02/2016 FINAL  Final  Urine culture     Status: None   Collection Time: 08/28/16 10:56 AM  Result Value Ref Range Status   Specimen Description URINE, RANDOM  Final   Special Requests NONE  Final   Culture NO GROWTH  Final   Report Status 08/29/2016 FINAL  Final     Labs: Basic Metabolic Panel:  Recent Labs Lab 08/28/16 0912  08/30/16 0112 08/31/16 0753 09/01/16 1001 09/02/16 0628 09/03/16 0531  NA 172*  < > 166* 162* 145 152* 151*  K 6.5*  < > 3.8 2.9* 2.5* 3.5 2.4*  CL >130*  < > >130* >130* 111 115* 116*  CO2 20*  < > 20* 22 24 29 26   GLUCOSE 205*  < > 215* 187* 198* 186* 151*  BUN 127*  < > 79* 33* 13 11 13   CREATININE 4.19*  < > 2.40* 1.29* 0.86 0.95 0.86  CALCIUM 8.3*  < > 8.3* 7.8* 6.8* 7.1* 6.9*  MG 3.0*  --   --   --   --   --  1.3*  PHOS 6.2*  --   --   --   --   --  1.9*  < > = values in this interval not displayed. Liver Function Tests:  Recent Labs Lab 08/28/16 0912 09/03/16 0531  AST 41 41  ALT 26 23  ALKPHOS 77 78  BILITOT 0.5 0.9  PROT 5.7* 5.2*  ALBUMIN 2.2* 2.0*    Recent Labs Lab 08/28/16 0912  AMMONIA 21   CBC:  Recent  Labs Lab  08/28/16 0912 08/30/16 0112 08/31/16 0753 09/01/16 1001 09/02/16 0628 09/03/16 0531  WBC 32.0* 24.8* 18.2* 16.0* 13.5* 12.0*  NEUTROABS 28.5*  --   --   --   --  9.4*  HGB 16.4* 15.2* 14.7 14.8 15.7* 14.5  HCT 53.2* 49.1* 47.6* 45.7 48.5* 45.4  MCV 95.7 91.8 91.0 87.4 88.7 88.0  PLT 257 182 135* 128* 122* 139*   Cardiac Enzymes:  Recent Labs Lab 08/28/16 0912 08/28/16 1415 08/28/16 1923  CKTOTAL 180  --   --   TROPONINI 0.03* 0.13* 0.03*   CBG:  Recent Labs Lab 09/02/16 2008 09/03/16 0009 09/03/16 0355 09/03/16 0753 09/03/16 1201  GLUCAP 150* 118* 130* 133* 197*   Urinalysis    Component Value Date/Time   COLORURINE YELLOW 08/28/2016 1056   APPEARANCEUR HAZY (A) 08/28/2016 1056   LABSPEC 1.017 08/28/2016 1056   PHURINE 5.0 08/28/2016 1056   GLUCOSEU NEGATIVE 08/28/2016 1056   HGBUR MODERATE (A) 08/28/2016 1056   BILIRUBINUR NEGATIVE 08/28/2016 1056   KETONESUR NEGATIVE 08/28/2016 1056   PROTEINUR NEGATIVE 08/28/2016 1056   NITRITE NEGATIVE 08/28/2016 1056   LEUKOCYTESUR LARGE (A) 08/28/2016 1056   Microbiology Recent Results (from the past 240 hour(s))  MRSA PCR Screening     Status: Abnormal   Collection Time: 08/28/16  6:44 AM  Result Value Ref Range Status   MRSA by PCR POSITIVE (A) NEGATIVE Final    Comment:        The GeneXpert MRSA Assay (FDA approved for NASAL specimens only), is one component of a comprehensive MRSA colonization surveillance program. It is not intended to diagnose MRSA infection nor to guide or monitor treatment for MRSA infections. RESULT CALLED TO, READ BACK BY AND VERIFIED WITH: Edison Pace [phone] 11.22.17 N. MORRIS   Culture, blood (Routine X 2) w Reflex to ID Panel     Status: None   Collection Time: 08/28/16 10:20 AM  Result Value Ref Range Status   Specimen Description BLOOD LEFT ARM  Final   Special Requests IN PEDIATRIC BOTTLE 1.5CC  Final   Culture NO GROWTH 5 DAYS  Final   Report Status 09/02/2016  FINAL  Final  Culture, blood (Routine X 2) w Reflex to ID Panel     Status: None   Collection Time: 08/28/16 10:28 AM  Result Value Ref Range Status   Specimen Description BLOOD RIGHT ARM  Final   Special Requests IN PEDIATRIC BOTTLE [facility]  Final   Culture NO GROWTH 5 DAYS  Final   Report Status 09/02/2016 FINAL  Final  Urine culture     Status: None   Collection Time: 08/28/16 10:56 AM  Result Value Ref Range Status   Specimen Description URINE, RANDOM  Final   Special Requests NONE  Final   Culture NO GROWTH  Final   Report Status 08/29/2016 FINAL  Final    Time coordinating discharge: Over 30 minutes  Hazeline Junker, MD  [facility] 09/03/2016, 3:52 PM Pager [phone]  If 7PM-7AM, please contact night-coverage [email or web address] Password [email or web address]

## 2016-09-03 NOTE — Progress Notes (Signed)
CRITICAL VALUE ALERT  Critical value received:  K 2.4  Date of notification:  09/03/2016  Time of notification:  0645  Critical value read back:Yes.    Nurse who received alert:  Sue LushKaelin Romesberg  MD notified (1st page):  NP K. Schorr  Time of first page:  [phone]  MD notified (2nd page):  Time of second page:  Responding MD:  NP. [name]  Time MD responded:  [phone]

## 2016-09-03 NOTE — Progress Notes (Signed)
Initial Nutrition Assessment  DOCUMENTATION CODES:   Obesity unspecified  INTERVENTION:   -Magic Cup TID with meals -If pt and family desire aggressive measures after goals of care meeting and pt not alert enough to take adequate PO, consider:  Initiate Jevity 1.2 @ 20 ml/hr via NGT and increase by 10 ml every 4 hours to goal rate of 60 ml/hr.   Tube feeding regimen provides 1728 kcal (100% of needs), 80 grams of protein, and 1162 ml of H2O.   NUTRITION DIAGNOSIS:   Inadequate oral intake related to lethargy/confusion as evidenced by meal completion < 25%.  GOAL:   Patient will meet greater than or equal to 90% of their needs  MONITOR:   PO intake, Supplement acceptance, Diet advancement, Labs, Weight trends, Skin, I & O's  REASON FOR ASSESSMENT:   Low Braden    ASSESSMENT:   [name] is a 75 y.o. female with medical history significant for HTN, Anita ButteryDiabetes, Arthritis, Dementia, Generalized Anxiety Disorder, COPD, Hyperlipidemia, Insomnia, History of Trigeminal Neuralgia, History of Major Depressive Disorders, Obstructive Sleep Apnea, History of recurrent UTIs and other comorbidts recently treated with 3 days of Cipro from 08-18-10/14. She is a bedbound patient brought from nursing [facility] with a 4 day history of lethargy and was found to have a VRE UTI, AKI, Hypernatremia. Patient is still confused and altered but not to the point where she could not swallow. SLP did a swallow eval and recommended Thin liquid dietes with whole meds with Puree diet. Palliative Care Medicine Team was consulted for Goals of Care meeting given the patient's poor prognosis and have scheduled a Family Meeting tomorrow at 11:00 AM.  Pt admitted with confusion secondary to hypernatremia and UTI.   Pt was not alert enough to take PO until yesterday; pt passed swallow evaluation and was advanced to a dysphagia 1 diet with thin liquids. Per SLP note this AM, pt has been holding  food and refusing most PO's.   No wt hx available to assess wt changes.   Pt unable to participate in interview. No family or caregivers at bedside. Per Palliative Care note, family meeting scheduled for this AM to discuss goals of care.   Nutrition-Focused physical exam completed. Findings are mild fat depletion, mild muscle depletion, and mild edema.   Pt with increased nutrient needs related to pressure injuries; RD will provide supplements, however, concern about pt's ability to meet needs via PO route.  Labs reviewed: Na: 151, K: 2.4, CBGS: 118-130.   Diet Order:  DIET - DYS 1 Room service appropriate? Yes; Fluid consistency: Thin  Skin:  Wound (see comment) (st II sacrum and buttocks)  Last BM:  09/02/16  Height:   Ht Readings from Last 1 Encounters:  09/03/16 5\' 5"  (1.651 m)    Weight:   Wt Readings from Last 1 Encounters:  09/03/16 199 lb (90.3 kg)    Ideal Body Weight:  56.8 kg  BMI:  Body mass index is 33.12 kg/m.  Estimated Nutritional Needs:   Kcal:  1700-1900  Protein:  85-100 grams  Fluid:  >1.7 L  EDUCATION NEEDS:   No education needs identified at this time  Sawyer Mentzer A. Mayford KnifeWilliams, RD, LDN, CDE Pager: [phone] After hours Pager: [phone]

## 2016-09-04 DIAGNOSIS — G9340 Encephalopathy, unspecified: Secondary | ICD-10-CM

## 2016-09-04 DIAGNOSIS — A419 Sepsis, unspecified organism: Secondary | ICD-10-CM

## 2016-09-04 DIAGNOSIS — Z515 Encounter for palliative care: Secondary | ICD-10-CM

## 2016-10-07 DEATH — deceased

## 2017-08-11 IMAGING — DX DG ABD PORTABLE 1V
1 series · 1 of 1 positions shown · non-contrast
Comparison: None.

CLINICAL DATA: 75-year-old female with a history of confusion

EXAM:
PORTABLE ABDOMEN - 1 VIEW

[abdomen supine]
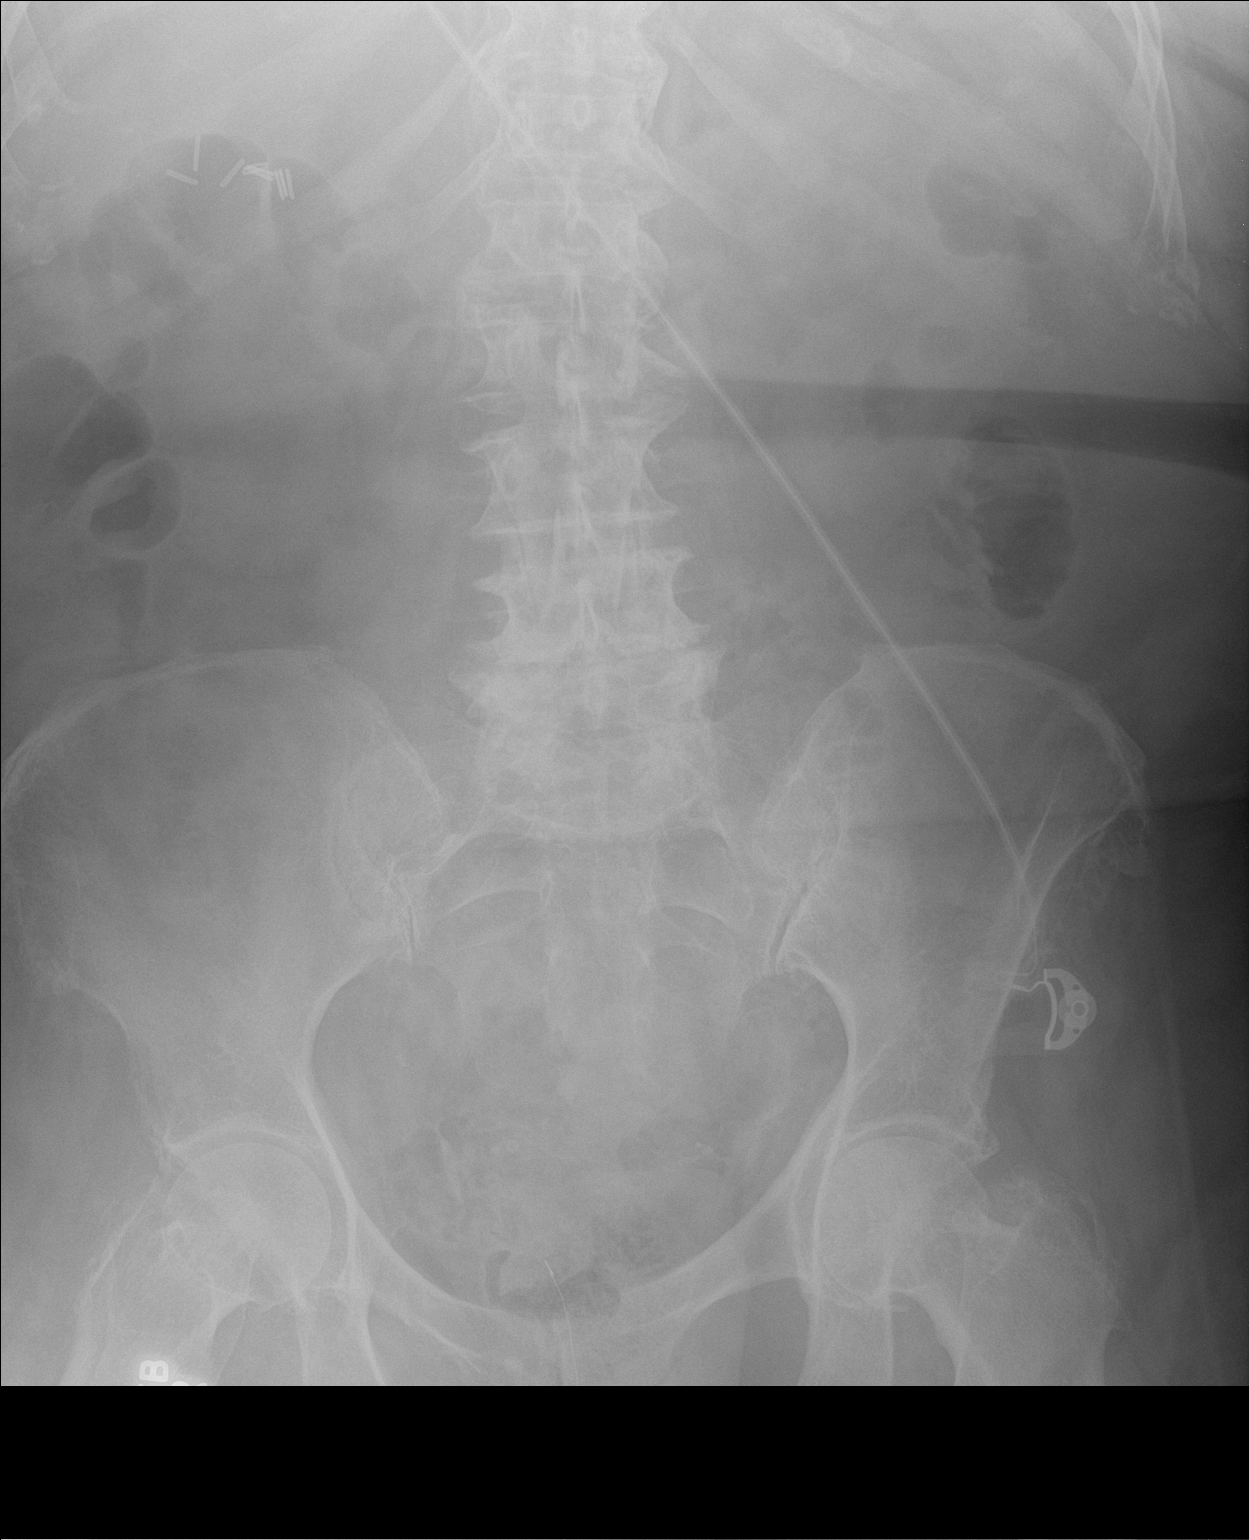

[1 of 1 positions shown; findings below may reference images not displayed]

FINDINGS: Minimal gas within small bowel and colon.  No abnormal distention.

No radiopaque foreign body.

Urinary catheter in position.

Surgical changes of cholecystectomy.

No unexpected calcification or soft tissue density.

Degenerative changes of the spine and bilateral hips.

Frontal view demonstrates disruption of the left-sided pectineal
line, near the pubic symphysis. No comparison available.
IMPRESSION: Nonobstructive bowel gas pattern.

Urinary catheter in position.

This view demonstrates disruption of the left pectineal line,
potentially representing a pelvic fracture. Recommend correlation
with pelvic plain film series for further evaluation as there are no
comparisons available.

## 2017-08-14 IMAGING — CR DG HIP (WITH OR WITHOUT PELVIS) 3-4V BILAT
5 series · 5 of 5 positions shown · non-contrast
Comparison: 08/28/2016

CLINICAL DATA: Questionable superior pubic ramus fracture on the
left on prior plain film, followup exam

EXAM:
DG HIP (WITH OR WITHOUT PELVIS) 3-4V BILAT

[pelvis ap]
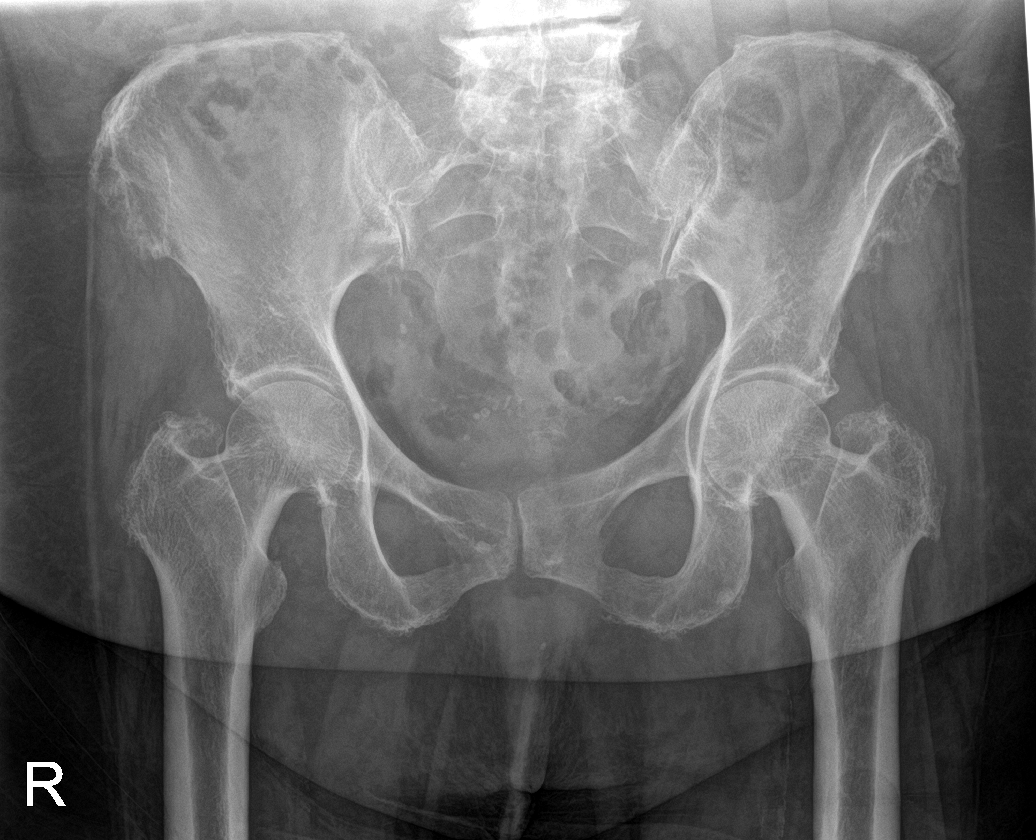

[hip ap (1 of 2)]
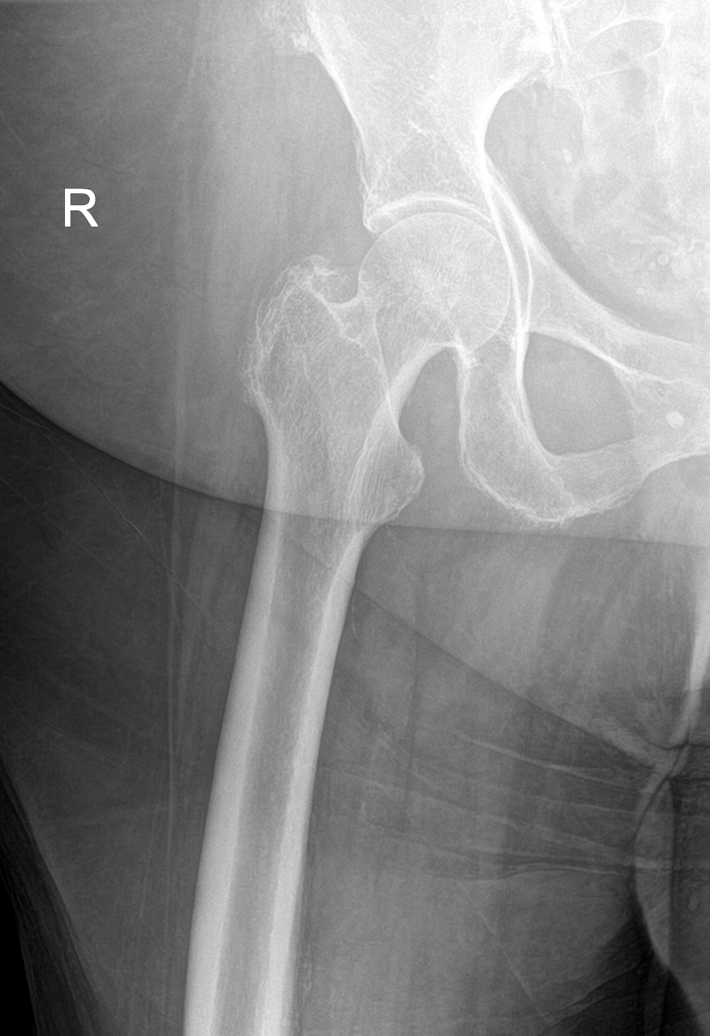

[hip lat (1 of 2)]
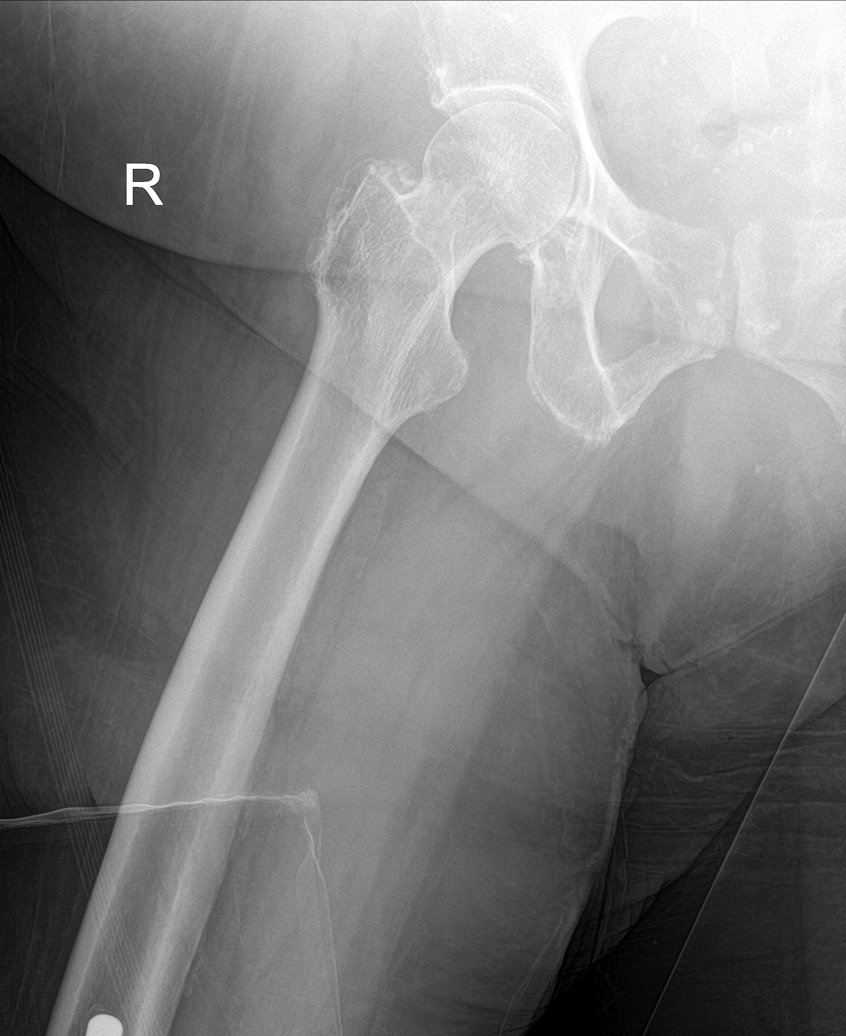

[hip ap (2 of 2)]
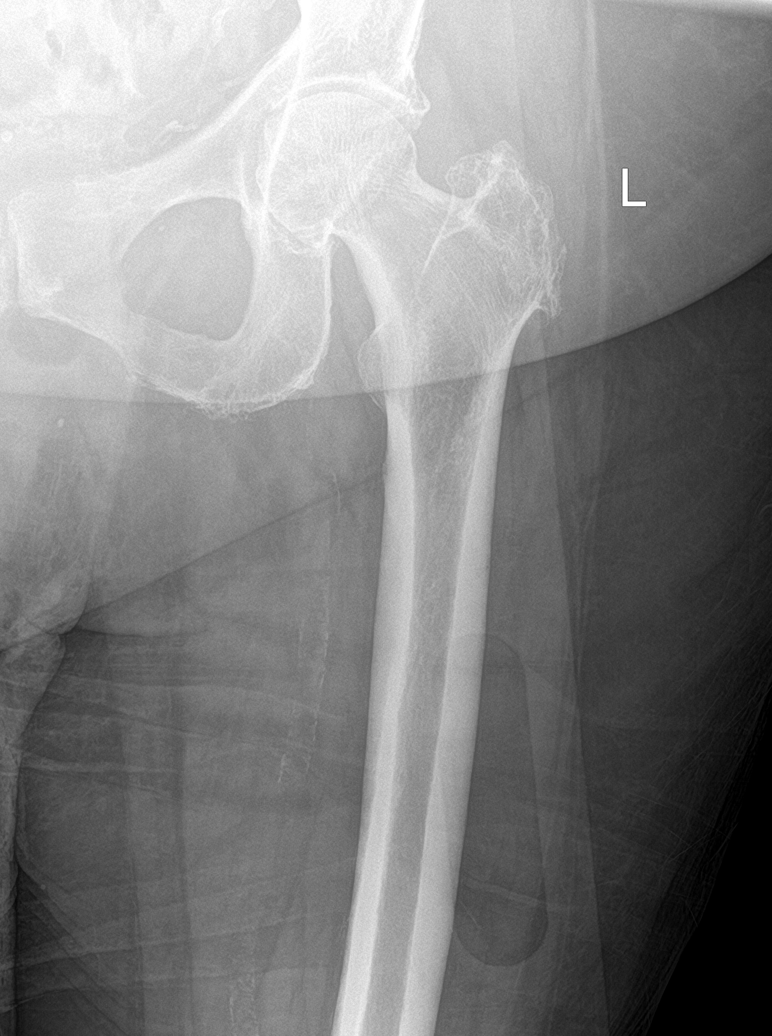

[hip lat (2 of 2)]
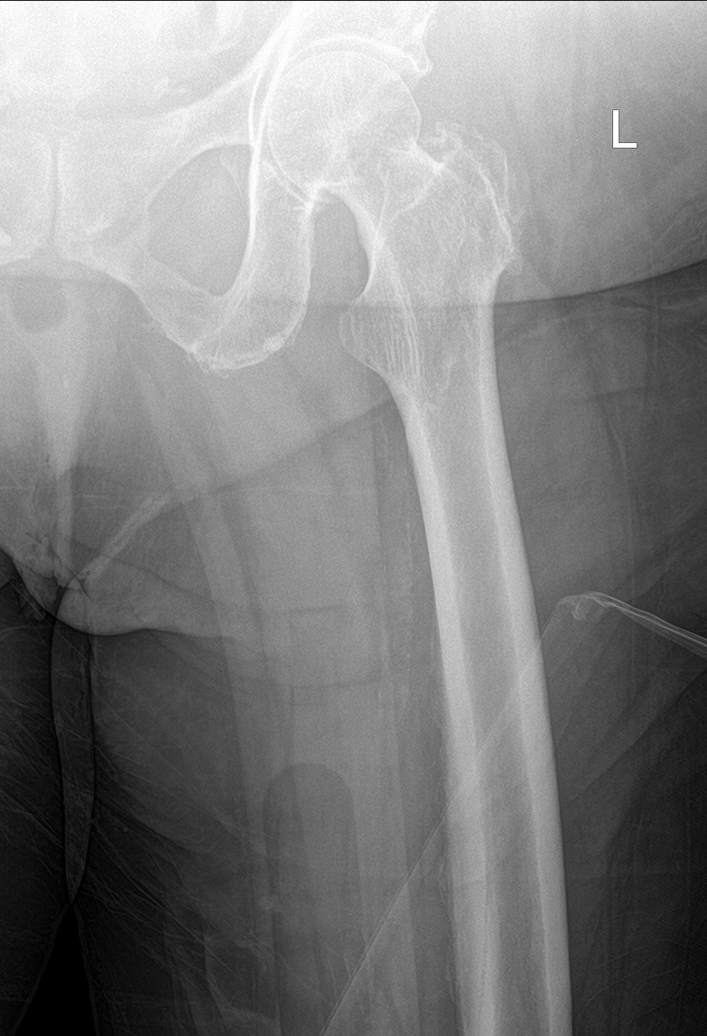

[5 of 5 positions shown; findings below may reference images not displayed]

FINDINGS: Mild degenerative changes of the hip joints and lumbar spine are
seen. No acute fracture or dislocation is noted. The abnormality in
the superior pubic ramus on the left is felt to be artifactual in
nature as no abnormality is noted.
IMPRESSION: No evidence of acute fracture. Degenerative changes of the hip
joints and lumbar spine are noted.
